# Patient Record
Sex: Female | Born: 1960 | Race: White | Hispanic: No | Marital: Single | Smoking: Never smoker
Health system: Southern US, Community
[De-identification: ages and names within clinical notes are randomized; demographics above are authoritative.]

## PROBLEM LIST (undated history)

## (undated) DIAGNOSIS — N803 Endometriosis of pelvic peritoneum: Secondary | ICD-10-CM

## (undated) DIAGNOSIS — F419 Anxiety disorder, unspecified: Secondary | ICD-10-CM

## (undated) DIAGNOSIS — N83209 Unspecified ovarian cyst, unspecified side: Secondary | ICD-10-CM

## (undated) DIAGNOSIS — F32A Depression, unspecified: Secondary | ICD-10-CM

## (undated) DIAGNOSIS — F329 Major depressive disorder, single episode, unspecified: Secondary | ICD-10-CM

## (undated) HISTORY — DX: Major depressive disorder, single episode, unspecified: F32.9

## (undated) HISTORY — DX: Anxiety disorder, unspecified: F41.9

## (undated) HISTORY — DX: Endometriosis of pelvic peritoneum: N80.3

## (undated) HISTORY — PX: WISDOM TOOTH EXTRACTION: SHX21

## (undated) HISTORY — DX: Unspecified ovarian cyst, unspecified side: N83.209

## (undated) HISTORY — PX: ACHILLES TENDON REPAIR: SUR1153

## (undated) HISTORY — DX: Depression, unspecified: F32.A

## (undated) HISTORY — PX: ABDOMINAL HYSTERECTOMY: SHX81

---

## 1998-08-27 ENCOUNTER — Other Ambulatory Visit: Admission: RE | Admit: 1998-08-27 | Discharge: 1998-08-27 | Payer: Self-pay | Admitting: *Deleted

## 1999-08-19 ENCOUNTER — Other Ambulatory Visit: Admission: RE | Admit: 1999-08-19 | Discharge: 1999-08-19 | Payer: Self-pay | Admitting: Obstetrics and Gynecology

## 2000-05-03 ENCOUNTER — Inpatient Hospital Stay (HOSPITAL_COMMUNITY): Admission: RE | Admit: 2000-05-03 | Discharge: 2000-05-05 | Payer: Self-pay | Admitting: Obstetrics and Gynecology

## 2000-05-03 ENCOUNTER — Encounter (INDEPENDENT_AMBULATORY_CARE_PROVIDER_SITE_OTHER): Payer: Self-pay

## 2001-04-27 ENCOUNTER — Other Ambulatory Visit: Admission: RE | Admit: 2001-04-27 | Discharge: 2001-04-27 | Payer: Self-pay | Admitting: Obstetrics and Gynecology

## 2002-10-23 ENCOUNTER — Encounter: Payer: Self-pay | Admitting: Family Medicine

## 2002-10-23 ENCOUNTER — Ambulatory Visit (HOSPITAL_COMMUNITY): Admission: RE | Admit: 2002-10-23 | Discharge: 2002-10-23 | Payer: Self-pay | Admitting: Family Medicine

## 2003-04-06 ENCOUNTER — Encounter: Payer: Self-pay | Admitting: Family Medicine

## 2003-04-06 LAB — CONVERTED CEMR LAB
Blood Glucose, Fasting: 109 mg/dL
TSH: 3.47 microintl units/mL

## 2004-04-09 ENCOUNTER — Ambulatory Visit: Payer: Self-pay | Admitting: Family Medicine

## 2004-04-09 LAB — CONVERTED CEMR LAB
Blood Glucose, Fasting: 91 mg/dL
RBC count: 4.49 10*6/uL
TSH: 2.69 microintl units/mL
WBC, blood: 7.2 10*3/uL

## 2004-04-16 ENCOUNTER — Ambulatory Visit: Payer: Self-pay | Admitting: Family Medicine

## 2004-04-29 ENCOUNTER — Ambulatory Visit (HOSPITAL_COMMUNITY): Admission: RE | Admit: 2004-04-29 | Discharge: 2004-04-29 | Payer: Self-pay | Admitting: Family Medicine

## 2004-06-10 ENCOUNTER — Ambulatory Visit: Payer: Self-pay | Admitting: Family Medicine

## 2004-07-30 ENCOUNTER — Ambulatory Visit: Payer: Self-pay | Admitting: Family Medicine

## 2004-08-22 ENCOUNTER — Ambulatory Visit: Payer: Self-pay | Admitting: Family Medicine

## 2004-09-10 ENCOUNTER — Ambulatory Visit: Payer: Self-pay | Admitting: Family Medicine

## 2004-09-11 ENCOUNTER — Ambulatory Visit: Payer: Self-pay | Admitting: Family Medicine

## 2004-12-17 ENCOUNTER — Ambulatory Visit: Payer: Self-pay | Admitting: Family Medicine

## 2004-12-17 LAB — CONVERTED CEMR LAB: TSH: 3.72 microintl units/mL

## 2004-12-19 ENCOUNTER — Ambulatory Visit: Payer: Self-pay | Admitting: Family Medicine

## 2005-03-23 ENCOUNTER — Ambulatory Visit: Payer: Self-pay | Admitting: Family Medicine

## 2005-06-22 ENCOUNTER — Ambulatory Visit: Payer: Self-pay | Admitting: Family Medicine

## 2005-06-22 LAB — CONVERTED CEMR LAB
Blood Glucose, Fasting: 97 mg/dL
TSH: 5.45 microintl units/mL

## 2005-06-25 ENCOUNTER — Ambulatory Visit: Payer: Self-pay | Admitting: Family Medicine

## 2005-07-07 ENCOUNTER — Encounter: Admission: RE | Admit: 2005-07-07 | Discharge: 2005-07-07 | Payer: Self-pay | Admitting: Family Medicine

## 2005-07-10 ENCOUNTER — Ambulatory Visit: Payer: Self-pay | Admitting: Family Medicine

## 2005-09-21 ENCOUNTER — Ambulatory Visit: Payer: Self-pay | Admitting: Family Medicine

## 2005-09-28 ENCOUNTER — Ambulatory Visit: Payer: Self-pay | Admitting: Family Medicine

## 2006-02-25 ENCOUNTER — Encounter: Payer: Self-pay | Admitting: Family Medicine

## 2006-02-25 ENCOUNTER — Ambulatory Visit: Payer: Self-pay | Admitting: Family Medicine

## 2006-02-25 LAB — CONVERTED CEMR LAB
Blood Glucose, Fasting: 90 mg/dL
RBC count: 4.51 10*6/uL
WBC, blood: 6.5 10*3/uL

## 2006-03-01 ENCOUNTER — Encounter: Admission: RE | Admit: 2006-03-01 | Discharge: 2006-03-01 | Payer: Self-pay | Admitting: Family Medicine

## 2006-05-31 ENCOUNTER — Ambulatory Visit: Payer: Self-pay | Admitting: Family Medicine

## 2006-06-28 ENCOUNTER — Ambulatory Visit: Payer: Self-pay | Admitting: Family Medicine

## 2006-06-28 LAB — CONVERTED CEMR LAB
ALT: 14 units/L (ref 0–40)
AST: 16 units/L (ref 0–37)
Albumin: 3.7 g/dL (ref 3.5–5.2)
Alkaline Phosphatase: 48 units/L (ref 39–117)
BUN: 10 mg/dL (ref 6–23)
Blood Glucose, Fasting: 83 mg/dL
Calcium: 8.8 mg/dL (ref 8.4–10.5)
Chloride: 105 meq/L (ref 96–112)
Creatinine, Ser: 0.6 mg/dL (ref 0.4–1.2)
GFR calc non Af Amer: 115 mL/min
HDL: 32.1 mg/dL — ABNORMAL LOW (ref >39.0)
LDL Cholesterol: 127 mg/dL — ABNORMAL HIGH (ref 0–99)
TSH: 4.89 microintl units/mL
TSH: 4.89 microintl units/mL (ref 0.35–5.50)
Total Bilirubin: 0.5 mg/dL (ref 0.3–1.2)
VLDL: 37 mg/dL (ref 0–40)

## 2006-06-30 ENCOUNTER — Ambulatory Visit: Payer: Self-pay | Admitting: Family Medicine

## 2006-08-18 ENCOUNTER — Encounter: Admission: RE | Admit: 2006-08-18 | Discharge: 2006-08-18 | Payer: Self-pay | Admitting: Family Medicine

## 2007-03-01 ENCOUNTER — Telehealth: Payer: Self-pay | Admitting: Family Medicine

## 2007-03-02 ENCOUNTER — Ambulatory Visit: Payer: Self-pay | Admitting: Family Medicine

## 2007-05-27 ENCOUNTER — Telehealth: Payer: Self-pay | Admitting: Family Medicine

## 2007-08-03 ENCOUNTER — Telehealth: Payer: Self-pay | Admitting: Family Medicine

## 2007-08-04 ENCOUNTER — Ambulatory Visit: Payer: Self-pay | Admitting: Family Medicine

## 2007-08-08 ENCOUNTER — Telehealth: Payer: Self-pay | Admitting: Family Medicine

## 2007-09-02 ENCOUNTER — Telehealth: Payer: Self-pay | Admitting: Family Medicine

## 2007-09-15 ENCOUNTER — Encounter: Payer: Self-pay | Admitting: Family Medicine

## 2007-11-08 ENCOUNTER — Ambulatory Visit: Payer: Self-pay | Admitting: Family Medicine

## 2007-11-08 DIAGNOSIS — F341 Dysthymic disorder: Secondary | ICD-10-CM | POA: Insufficient documentation

## 2007-11-08 DIAGNOSIS — E663 Overweight: Secondary | ICD-10-CM | POA: Insufficient documentation

## 2007-11-08 DIAGNOSIS — G43009 Migraine without aura, not intractable, without status migrainosus: Secondary | ICD-10-CM | POA: Insufficient documentation

## 2007-11-08 DIAGNOSIS — E785 Hyperlipidemia, unspecified: Secondary | ICD-10-CM | POA: Insufficient documentation

## 2007-11-09 ENCOUNTER — Encounter: Payer: Self-pay | Admitting: Family Medicine

## 2007-11-10 DIAGNOSIS — T7840XA Allergy, unspecified, initial encounter: Secondary | ICD-10-CM | POA: Insufficient documentation

## 2007-11-15 ENCOUNTER — Ambulatory Visit: Payer: Self-pay | Admitting: Family Medicine

## 2007-11-15 LAB — CONVERTED CEMR LAB
ALT: 10 units/L (ref 0–35)
AST: 15 units/L (ref 0–37)
Albumin: 3.9 g/dL (ref 3.5–5.2)
Alkaline Phosphatase: 55 units/L (ref 39–117)
BUN: 9 mg/dL (ref 6–23)
Basophils Absolute: 0 10*3/uL (ref 0.0–0.1)
Basophils Relative: 0.7 % (ref 0.0–1.0)
Bilirubin, Direct: 0.1 mg/dL (ref 0.0–0.3)
CO2: 26 meq/L (ref 19–32)
Calcium: 9.1 mg/dL (ref 8.4–10.5)
Chloride: 110 meq/L (ref 96–112)
Cholesterol: 208 mg/dL (ref 0–200)
Creatinine, Ser: 0.8 mg/dL (ref 0.4–1.2)
Direct LDL: 159.6 mg/dL
Eosinophils Absolute: 0.3 10*3/uL (ref 0.0–0.7)
Eosinophils Relative: 4.5 % (ref 0.0–5.0)
GFR calc Af Amer: 99 mL/min
GFR calc non Af Amer: 82 mL/min
Glucose, Bld: 103 mg/dL — ABNORMAL HIGH (ref 70–99)
HCT: 40.2 % (ref 36.0–46.0)
HDL: 32.5 mg/dL — ABNORMAL LOW (ref >39.0)
Hemoglobin: 13.8 g/dL (ref 12.0–15.0)
Lymphocytes Relative: 38.7 % (ref 12.0–46.0)
MCHC: 34.4 g/dL (ref 30.0–36.0)
MCV: 90.5 fL (ref 78.0–100.0)
Monocytes Absolute: 0.3 10*3/uL (ref 0.1–1.0)
Monocytes Relative: 5.5 % (ref 3.0–12.0)
Neutro Abs: 3.1 10*3/uL (ref 1.4–7.7)
Neutrophils Relative %: 50.6 % (ref 43.0–77.0)
Platelets: 270 10*3/uL (ref 150–400)
Potassium: 4.1 meq/L (ref 3.5–5.1)
RBC: 4.44 M/uL (ref 3.87–5.11)
RDW: 12.3 % (ref 11.5–14.6)
Sodium: 141 meq/L (ref 135–145)
TSH: 3.16 microintl units/mL (ref 0.35–5.50)
Total Bilirubin: 0.8 mg/dL (ref 0.3–1.2)
Total CHOL/HDL Ratio: 6.4
Total Protein: 6.7 g/dL (ref 6.0–8.3)
Triglycerides: 90 mg/dL (ref 0–149)
VLDL: 18 mg/dL (ref 0–40)
WBC: 6.1 10*3/uL (ref 4.5–10.5)

## 2007-12-07 ENCOUNTER — Telehealth (INDEPENDENT_AMBULATORY_CARE_PROVIDER_SITE_OTHER): Payer: Self-pay | Admitting: *Deleted

## 2007-12-27 ENCOUNTER — Ambulatory Visit: Payer: Self-pay | Admitting: Family Medicine

## 2008-01-03 ENCOUNTER — Ambulatory Visit: Payer: Self-pay | Admitting: Family Medicine

## 2008-01-09 ENCOUNTER — Telehealth: Payer: Self-pay | Admitting: Family Medicine

## 2008-04-03 ENCOUNTER — Telehealth: Payer: Self-pay | Admitting: Family Medicine

## 2008-04-16 ENCOUNTER — Telehealth: Payer: Self-pay | Admitting: Family Medicine

## 2008-07-19 ENCOUNTER — Telehealth (INDEPENDENT_AMBULATORY_CARE_PROVIDER_SITE_OTHER): Payer: Self-pay | Admitting: *Deleted

## 2008-07-19 ENCOUNTER — Ambulatory Visit: Payer: Self-pay | Admitting: Family Medicine

## 2008-08-22 ENCOUNTER — Telehealth: Payer: Self-pay | Admitting: Family Medicine

## 2008-11-13 ENCOUNTER — Telehealth: Payer: Self-pay | Admitting: Family Medicine

## 2008-11-16 ENCOUNTER — Ambulatory Visit: Payer: Self-pay | Admitting: Family Medicine

## 2008-11-16 LAB — CONVERTED CEMR LAB
ALT: 18 units/L (ref 0–35)
AST: 19 units/L (ref 0–37)
Albumin: 4.1 g/dL (ref 3.5–5.2)
Alkaline Phosphatase: 54 units/L (ref 39–117)
BUN: 9 mg/dL (ref 6–23)
Basophils Absolute: 0.1 10*3/uL (ref 0.0–0.1)
Basophils Relative: 1.1 % (ref 0.0–3.0)
Bilirubin, Direct: 0.1 mg/dL (ref 0.0–0.3)
CO2: 29 meq/L (ref 19–32)
Calcium: 9.1 mg/dL (ref 8.4–10.5)
Chloride: 108 meq/L (ref 96–112)
Cholesterol: 215 mg/dL — ABNORMAL HIGH (ref 0–200)
Creatinine, Ser: 0.9 mg/dL (ref 0.4–1.2)
Direct LDL: 180.6 mg/dL
Eosinophils Absolute: 0.3 10*3/uL (ref 0.0–0.7)
Eosinophils Relative: 5.3 % — ABNORMAL HIGH (ref 0.0–5.0)
GFR calc non Af Amer: 71.29 mL/min (ref >60)
Glucose, Bld: 98 mg/dL (ref 70–99)
HCT: 40.1 % (ref 36.0–46.0)
HDL: 43.5 mg/dL (ref >39.00)
Hemoglobin: 13.9 g/dL (ref 12.0–15.0)
Lymphocytes Relative: 44.6 % (ref 12.0–46.0)
Lymphs Abs: 2.3 10*3/uL (ref 0.7–4.0)
MCHC: 34.7 g/dL (ref 30.0–36.0)
MCV: 88.5 fL (ref 78.0–100.0)
Monocytes Absolute: 0.4 10*3/uL (ref 0.1–1.0)
Monocytes Relative: 7.5 % (ref 3.0–12.0)
Neutro Abs: 2.2 10*3/uL (ref 1.4–7.7)
Neutrophils Relative %: 41.5 % — ABNORMAL LOW (ref 43.0–77.0)
Platelets: 249 10*3/uL (ref 150.0–400.0)
Potassium: 4.3 meq/L (ref 3.5–5.1)
RBC: 4.53 M/uL (ref 3.87–5.11)
RDW: 12.5 % (ref 11.5–14.6)
Sodium: 143 meq/L (ref 135–145)
TSH: 3.27 microintl units/mL (ref 0.35–5.50)
Total Bilirubin: 0.5 mg/dL (ref 0.3–1.2)
Total CHOL/HDL Ratio: 5
Total Protein: 7 g/dL (ref 6.0–8.3)
Triglycerides: 59 mg/dL (ref 0.0–149.0)
VLDL: 11.8 mg/dL (ref 0.0–40.0)
WBC: 5.3 10*3/uL (ref 4.5–10.5)

## 2008-11-21 ENCOUNTER — Ambulatory Visit: Payer: Self-pay | Admitting: Family Medicine

## 2008-11-26 ENCOUNTER — Encounter: Admission: RE | Admit: 2008-11-26 | Discharge: 2008-11-26 | Payer: Self-pay | Admitting: Family Medicine

## 2008-11-28 ENCOUNTER — Encounter (INDEPENDENT_AMBULATORY_CARE_PROVIDER_SITE_OTHER): Payer: Self-pay | Admitting: *Deleted

## 2009-06-11 ENCOUNTER — Telehealth: Payer: Self-pay | Admitting: Family Medicine

## 2009-09-16 ENCOUNTER — Ambulatory Visit: Payer: Self-pay | Admitting: Family Medicine

## 2009-09-18 ENCOUNTER — Ambulatory Visit: Payer: Self-pay | Admitting: Family Medicine

## 2009-11-27 ENCOUNTER — Ambulatory Visit: Payer: Self-pay | Admitting: Family Medicine

## 2009-12-18 ENCOUNTER — Encounter (INDEPENDENT_AMBULATORY_CARE_PROVIDER_SITE_OTHER): Payer: Self-pay | Admitting: *Deleted

## 2009-12-30 ENCOUNTER — Telehealth: Payer: Self-pay | Admitting: Family Medicine

## 2010-02-14 ENCOUNTER — Ambulatory Visit: Payer: Self-pay | Admitting: Family Medicine

## 2010-02-19 ENCOUNTER — Ambulatory Visit: Payer: Self-pay | Admitting: Family Medicine

## 2010-02-19 LAB — CONVERTED CEMR LAB
ALT: 14 units/L (ref 0–35)
AST: 16 units/L (ref 0–37)
Albumin: 3.9 g/dL (ref 3.5–5.2)
Alkaline Phosphatase: 58 units/L (ref 39–117)
BUN: 12 mg/dL (ref 6–23)
Basophils Absolute: 0.1 10*3/uL (ref 0.0–0.1)
Basophils Relative: 1 % (ref 0.0–3.0)
Bilirubin, Direct: 0.1 mg/dL (ref 0.0–0.3)
CO2: 24 meq/L (ref 19–32)
Calcium: 8.9 mg/dL (ref 8.4–10.5)
Chloride: 106 meq/L (ref 96–112)
Cholesterol: 228 mg/dL — ABNORMAL HIGH (ref 0–200)
Creatinine, Ser: 0.7 mg/dL (ref 0.4–1.2)
Direct LDL: 187.3 mg/dL
Eosinophils Absolute: 0.3 10*3/uL (ref 0.0–0.7)
Eosinophils Relative: 3.9 % (ref 0.0–5.0)
GFR calc non Af Amer: 88.89 mL/min (ref >60)
Glucose, Bld: 99 mg/dL (ref 70–99)
HCT: 39.7 % (ref 36.0–46.0)
HDL: 48.2 mg/dL (ref >39.00)
Hemoglobin: 13.6 g/dL (ref 12.0–15.0)
Lymphocytes Relative: 36.4 % (ref 12.0–46.0)
Lymphs Abs: 2.4 10*3/uL (ref 0.7–4.0)
MCHC: 34.3 g/dL (ref 30.0–36.0)
MCV: 90.1 fL (ref 78.0–100.0)
Monocytes Absolute: 0.4 10*3/uL (ref 0.1–1.0)
Monocytes Relative: 6.3 % (ref 3.0–12.0)
Neutro Abs: 3.5 10*3/uL (ref 1.4–7.7)
Neutrophils Relative %: 52.4 % (ref 43.0–77.0)
Platelets: 253 10*3/uL (ref 150.0–400.0)
Potassium: 4.2 meq/L (ref 3.5–5.1)
RBC: 4.41 M/uL (ref 3.87–5.11)
RDW: 13.8 % (ref 11.5–14.6)
Sodium: 138 meq/L (ref 135–145)
TSH: 3.21 microintl units/mL (ref 0.35–5.50)
Total Bilirubin: 0.4 mg/dL (ref 0.3–1.2)
Total CHOL/HDL Ratio: 5
Total Protein: 6.6 g/dL (ref 6.0–8.3)
Triglycerides: 90 mg/dL (ref 0.0–149.0)
VLDL: 18 mg/dL (ref 0.0–40.0)
WBC: 6.6 10*3/uL (ref 4.5–10.5)

## 2010-04-01 ENCOUNTER — Encounter: Payer: Self-pay | Admitting: Family Medicine

## 2010-06-08 ENCOUNTER — Encounter: Payer: Self-pay | Admitting: Family Medicine
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
## 2010-06-17 NOTE — Letter (Signed)
Summary: Kendra Cannon letter  [place] [facility]  [address] [place], [place] [address]   Phone: [phone]  Fax: [phone]       12/18/2009 MRN: [id number]  Kendra Cannon [address] [place], [place]  [address]  Dear Ms. Kendra Cannon [facility] - Eugenio Saenz, and [place] announce the retirement of Arta Silence, M.D., from full-time practice at the [facility] office effective November 14, 2009 and his plans of returning part-time.  It is important to Dr. Hetty Ely and to our practice that you understand that [facility] has seven physicians in our office for your health care needs.  We will continue to offer the same exceptional care that you have today.    Dr. Hetty Ely has spoken to many of you about his plans for retirement and returning part-time in the fall.   We will continue to work with you through the transition to schedule appointments for you in the office and meet the high standards that [facility] is committed to.   Again, it is with great pleasure that we share the news that Dr. Hetty Ely will return to [facility] at [facility] in October of 2011 with a reduced schedule.    If you have any questions, or would like to request an appointment with one of our physicians, please call us at [phone] and press the option for Scheduling an appointment.  We take pleasure in providing you with excellent patient care and look forward to seeing you at your next office visit.  Our [facility] Physicians are:  Tillman Abide, M.D. Laurita Quint, M.D. Roxy Manns, M.D. Kerby Nora, M.D. Hannah Beat, M.D. Ruthe Mannan, M.D. We proudly welcomed Raechel Ache, M.D. and Eustaquio Boyden, M.D. to the practice in July/August 2011.  Sincerely,  [place] [facility]

## 2010-06-17 NOTE — Progress Notes (Signed)
Summary: Rx Alprazolam  Phone Note Refill Request Call back at [phone] Message from:  [facility] on June 11, 2009 9:23 AM  Refills Requested: Medication #1:  ALPRAZOLAM 0.5 MG  TABS 1 two times a day PRN   Last Refilled: 02/28/2009 Received faxed refill request, please advise   Method Requested: Telephone to Pharmacy Initial call taken by: Linde Gillis CMA Duncan Dull),  June 11, 2009 9:23 AM  Follow-up for Phone Call        Rx called to pharmacy Follow-up by: Linde Gillis CMA Duncan Dull),  June 11, 2009 12:16 PM    Prescriptions: ALPRAZOLAM 0.5 MG  TABS (ALPRAZOLAM) 1 two times a day PRN  #60 x 5   Entered and Authorized by:   Shaune Leeks MD   Signed by:   Shaune Leeks MD on 06/11/2009   Method used:   Telephoned to ...       [facility]* (retail)       [address]       [place], [place]  [address]       Ph: [phone]       Fax: [phone]   RxID:   [id number]

## 2010-06-17 NOTE — Consult Note (Signed)
Summary: Dr.Todd Hyatt,[facility],Note  Dr.Todd Hyatt,[facility],Note   Imported By: Beau Fanny 04/15/2010 13:24:50  _____________________________________________________________________  External Attachment:    Type:   Image     Comment:   External Document

## 2010-06-17 NOTE — Progress Notes (Signed)
Summary: refill request for alprazolam  Phone Note Refill Request Message from:  Fax from Pharmacy  Refills Requested: Medication #1:  ALPRAZOLAM 0.5 MG  TABS 1 two times a day as needed   Last Refilled: 11/26/2009 Faxed request from [place], [phone].   Initial call taken by: Lowella Petties CMA,  December 30, 2009 8:34 AM  Follow-up for Phone Call        please call in.  I was unable to send electronically.  Follow-up by: Crawford Givens MD,  December 30, 2009 9:57 AM  Additional Follow-up for Phone Call Additional follow up Details #1::        Medication phoned to pharmacy.  Additional Follow-up by: Delilah Shan CMA Desiray Orchard Dull),  December 30, 2009 10:27 AM    Prescriptions: ALPRAZOLAM 0.5 MG  TABS (ALPRAZOLAM) 1 two times a day as needed  #30 x 1   Entered and Authorized by:   Crawford Givens MD   Signed by:   Crawford Givens MD on 12/30/2009   Method used:   Telephoned to ...       [facility]* (retail)       [address]       [place], [place]  [address]       Ph: [phone]       Fax: [phone]   RxID:   [id number]

## 2010-06-17 NOTE — Assessment & Plan Note (Signed)
Summary: hurt ankle/alc   Vital Signs:  Patient profile:   50 year old female Weight:      230.75 pounds BMI:     37.38 Temp:     98.4 degrees F oral Pulse rate:   88 / minute Pulse rhythm:   regular BP sitting:   130 / 70  (left arm) Cuff size:   large  Vitals Entered By: Sydell Axon LPN (Sep 16, 1608 3:47 PM) CC: Injured back of left heel Saturday night   History of Present Illness: Here for having stretched playing badmitton Sat night with her nephew while barefooted and felt a pull in the back of her ankle with pain then progressively going from the back of the ankle to the heel and up the  posterior lower leg, pain into the foot with dorsiflexion and sciatica like pain with lifting the foot and leg anteriorally. She has not seen echymosis and is swollen only just lateral to the achillees area.  Problems Prior to Update: 1)  Other Screening Mammogram  (ICD-V76.12) 2)  Common Migraine  (ICD-346.10) 3)  Overweight  (ICD-278.02) 4)  Panic Attacks, Situational  (ICD-300.01) 5)  Allergy, Environmental  (ICD-995.3) 6)  Anxiety Depression  (ICD-300.4) 7)  Hyperlipidemia  (ICD-272.4) 8)  Health Maintenance Exam  (ICD-V70.0) 9)  R Medial Epicondylitis of Elbow  (ICD-726.31) 10)  Other Specified Disorder of Skin, Cyst of Scalp  (ICD-709.8)  Medications Prior to Update: 1)  Wellbutrin Xl 300 Mg  Tb24 (Bupropion Hcl) .Marland Kitchen.. 1 Daily By Mouth 2)  Alprazolam 0.5 Mg  Tabs (Alprazolam) .Marland Kitchen.. 1 Two Times A Day Prn 3)  Imitrex 100 Mg  Tabs (Sumatriptan Succinate) .... As Directed Before Migraine 4)  Tylenol Pm Extra Strength 500-25 Mg Tabs (Diphenhydramine-Apap (Sleep)) .... As Needed By Mouth 5)  Advil Pm 200-25 Mg Caps (Ibuprofen-Diphenhydramine Hcl) .... As Needed By Mouth  Allergies: 1)  ! Adult Aspirin Ec Low Strength (Aspirin)  Physical Exam  General:  Well-developed,well-nourished,in no acute distress; alert,appropriate and cooperative throughout examination Head:  Normocephalic  and atraumatic without obvious abnormalities. No apparent alopecia or balding. Sinuses NT. Extremities:  L ankle intact, achillees area posteriorally fairly tender, no steopoff felt. able to dorsiflex, plantar flex with discomfort. Mild swelling laterally, no echymosis seen.   Impression & Recommendations:  Problem # 1:  L ACHILLES SPRAIN AND STRAIN (ICD-845.09) Assessment New Aggressive heat and ice, slow walking until seen. Advil 200mg  x 3 after each meal. Refer to Dr Patsy Lager.  Complete Medication List: 1)  Wellbutrin Xl 300 Mg Tb24 (Bupropion hcl) .Marland Kitchen.. 1 daily by mouth 2)  Alprazolam 0.5 Mg Tabs (Alprazolam) .Marland Kitchen.. 1 two times a day as needed 3)  Imitrex 100 Mg Tabs (Sumatriptan succinate) .... As directed before migraine 4)  Tylenol Pm Extra Strength 500-25 Mg Tabs (Diphenhydramine-apap (sleep)) .... As needed by mouth 5)  Advil Pm 200-25 Mg Caps (Ibuprofen-diphenhydramine hcl) .... As needed by mouth  Patient Instructions: 1)  Refer to DrCopland Wed pls.  Current Allergies (reviewed today): ! ADULT ASPIRIN EC LOW STRENGTH (ASPIRIN)
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
## 2010-07-03 ENCOUNTER — Encounter: Payer: Self-pay | Admitting: Family Medicine

## 2010-07-03 ENCOUNTER — Ambulatory Visit (INDEPENDENT_AMBULATORY_CARE_PROVIDER_SITE_OTHER): Payer: Commercial Managed Care - PPO | Admitting: Family Medicine

## 2010-07-03 DIAGNOSIS — J069 Acute upper respiratory infection, unspecified: Secondary | ICD-10-CM

## 2010-07-09 NOTE — Letter (Signed)
Summary: Out of Work  [facility] [employer] [facility]  [address] [place], [place] [address]   Phone: [phone]  Fax: [phone]    July 03, 2010   Employee:  Kendra Cannon    To Whom It May Concern:   For Medical reasons, please excuse the above named employee from work for the following dates:  Start:   07/03/2010  End:   07/05/2010  If you need additional information, please feel free to contact our office.         Sincerely,    Shaune Leeks MD
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
## 2010-07-21 ENCOUNTER — Telehealth: Payer: Self-pay | Admitting: Family Medicine

## 2010-07-29 NOTE — Progress Notes (Signed)
Summary: Rx Bupropion  Phone Note Refill Request Call back at [phone] Message from:  [facility] on July 21, 2010 10:20 AM  Refills Requested: Medication #1:  WELLBUTRIN XL 300 MG  TB24 1 daily by mouth   Last Refilled: 06/17/2010 Initial call taken by: Sydell Axon LPN,  July 21, 2010 10:21 AM    Prescriptions: WELLBUTRIN XL 300 MG  TB24 (BUPROPION HCL) 1 daily by mouth  #30 x 6   Entered and Authorized by:   Shaune Leeks MD   Signed by:   Shaune Leeks MD on 07/21/2010   Method used:   Electronically to        [facility]* (retail)       [address]       [place], [place]  [address]       Ph: [phone]       Fax: [phone]   RxID:   [id number] WELLBUTRIN XL 300 MG  TB24 (BUPROPION HCL) 1 daily by mouth  #30 x 6   Entered and Authorized by:   Shaune Leeks MD   Signed by:   Shaune Leeks MD on 07/21/2010   Method used:   Electronically to        [facility]* (retail)       [address]       [place], [place]  [address]       Ph: [phone]       Fax: [phone]   RxID:   [id number]

## 2010-08-25 ENCOUNTER — Other Ambulatory Visit: Payer: Self-pay | Admitting: *Deleted

## 2010-08-25 MED ORDER — ALPRAZOLAM 0.5 MG PO TABS: 0.5000 mg | ORAL_TABLET | Freq: Two times a day (BID) | ORAL | Status: DC | PRN

## 2010-08-25 NOTE — Telephone Encounter (Signed)
Ok to refill #60, 2RF

## 2010-08-25 NOTE — Telephone Encounter (Signed)
Rx called in as directed.   

## 2010-09-26 ENCOUNTER — Other Ambulatory Visit: Payer: Self-pay | Admitting: *Deleted

## 2010-09-26 MED ORDER — SUMATRIPTAN SUCCINATE 100 MG PO TABS: 100.0000 mg | ORAL_TABLET | Freq: Once | ORAL | Status: AC | PRN

## 2010-09-26 NOTE — Telephone Encounter (Signed)
Patient is requesting refill on Imitrex.  She called on yesterday but got no response, she is completely out.  Please advise.  Uses [facility].  Dr. Hetty Ely is out of the office until Wednesday.

## 2010-09-26 NOTE — Telephone Encounter (Signed)
Sent, please advise patient

## 2010-09-26 NOTE — Telephone Encounter (Signed)
Patient advised.

## 2010-10-03 NOTE — Discharge Summary (Signed)
[facility]  Patient:    Kendra Cannon, Kendra Cannon                  MRN: [id number] Adm. Date:  [id number] Disc. Date: [id number] Attending:  Brynda Peon                           Discharge Summary  DISCHARGE DIAGNOSIS:  Dysfunctional uterine bleeding and pelvic pain due to endometriosis.  HISTORY OF PRESENT ILLNESS:  This is a 50 year old divorced white female, gravida 1, para 1, with a long history of DUB that was unresponsive to oral contraceptive therapy.  She also had persistent right lower quadrant pain.  HOSPITAL COURSE:  She was admitted on May 03, 2000, and underwent a TAH/RSO.  She was noted to have endometriosis in the cul-de-sac, but the right tube and ovary appeared normal.  The uterus contained several small myomas. She had an estimated blood loss of 250 cc and no complications. Postoperatively, she had an uneventful postoperative course and was ready for discharge on postoperative day #2.  She was given full discharge instructions regarding pelvic rest.  Follow-up in the office in four weeks.  She will come back in two days for staple removal.  She was given a prescription for Percocet #20 1 p.o. q.4h. p.r.n. pain.  Pathology report did document the endometriosis, but with the biopsies from the pelvic peritoneal and the cul-de-sac the uterus had benign leiomyomata, and the tube and ovary had no pathologic abnormalities.  LABORATORY DATA:  H&H on discharge was 10.9 and 31.1.  On admission it had been 12.9 and 35.9. DD:  05/05/00 TD:  05/06/00 Job: [id number]

## 2010-10-03 NOTE — Discharge Summary (Signed)
[facility]  Patient:    Kendra Cannon, Kendra Cannon                  MRN: [id number] Adm. Date:  [id number] Disc. Date: [id number] Attending:  Brynda Peon                           Discharge Summary  DISCHARGE DIAGNOSES:  Dysfunctional uterine bleeding and pelvic pain due to endometriosis.  HISTORY:  This is a 50 year old divorced white female, G1, P1 with a long history of DUB that was unresponsive to oral contraceptive therapy.  She also had persistent right lower quadrant pain.  She was admitted on May 03, 2000 and underwent a TAH and BSO. She was noted to have endometriosis in the cul-de-sac but the right tube and ovary appeared normal. The uterus contained several small myomas.  She had an estimated blood loss of 250 ccs and no complications.  Postoperatively, she had an uneventful postoperative course and was ready for discharge on postoperative day two.  LABORATORY AND ACCESSORY DATA:  Pathology report did document the endometriosis but was biopsied from the pelvic peritoneum in the cul-de-sac. The uterus had benign leiomyomata and the tube and ovary had no pathologic abnormalities.  H&H on discharge was 10.9 and 31.1.  On admission it had been 12.9 and 35.9.  DISCHARGE INSTRUCTIONS/FOLLOW-UP:  She was given full discharge instructions regarding pelvic rest, follow-up in the office in four weeks, she will come back in two days for staple removal.  DISCHARGE MEDICATIONS:  Percocet #20 1 p.o. q.4.h. p.r.n. pain. DD:  05/05/00 TD:  05/06/00 Job: [id number]

## 2010-10-03 NOTE — Op Note (Signed)
[facility]  Patient:    Kendra Cannon, Kendra Cannon                  MRN: [id number] Proc. Date: 05/03/00 Adm. Date:  [id number] Attending:  Brynda Peon                           Operative Report  PREOPERATIVE DIAGNOSES:  Dysfunctional uterine bleeding unresponsive to medical management, right lower quadrant pain.  POSTOPERATIVE DIAGNOSES:  Dysfunctional uterine bleeding unresponsive to medical management, right lower quadrant pain.  PROCEDURE:  Total abdominal hysterectomy, right salpingo-oophorectomy.  SURGEON:  Dr. Amedeo Kinsman.  ASSISTANT:  Dr. Myrlene Broker.  ANESTHESIA:  General endotracheal.  ESTIMATED BLOOD LOSS:  250 cc.  COMPLICATIONS:  None.  DESCRIPTION OF PROCEDURE:  The patient was taken to the operating room and after the induction of adequate general endotracheal anesthesia was prepped and draped in the usual fashion and Foley catheter inserted. A Pfannenstiel incision was made and carried down to the fascia with a deep knife. The fascia was nicked and opened transversely. Kochers were used to grasp the fascial margins and it was separated from the underlying rectus muscles with sharp dissection. The peritoneum was elevated between hemostats and entered atraumatically. The abdomen was inspected. The upper abdomen showed there to be a smooth liver. The gallbladder contained no stones. There was no periaortic adenopathy. In the pelvis, the uterus was slightly boggy. The right ovary had a cyst consist with a corpus gluteal cyst approximately 1 cm. The left ovary appeared normal. There were some filmy adhesions between the right ovary and the pelvic side wall. In the posterior cul-de-sac, there were some powder burn areas of endometriosis immediately beneath the cervix between the uterosacral ligaments. The OConner-OSullivan retractor was placed, the bowel was packed away. The uterus was grasped with long Kellys at the cornu.  The round ligaments were elevated, sutured and then cut with Mayo scissors on each side. The anterior leaf of the broad ligament was taken down on the patients left. The pedicle containing the tube, the utero-ovarian ligament and the round ligament was clamped, cut and doubly tied retaining the left ovary. On the right side, the infundibulopelvic ligament was clamped, cut and doubly tied and the tube and ovary were taken with a surgical specimen. The uterine arteries were skeletonized, clamped, and tied. The hysterectomy continued down the cardinal ligament, clamping, cutting, and tying in sequence. The uterosacral ligaments were taken separately. After the uterosacral ligament pedicle was taken, the next clamp that was taken actually entered the vagina beneath the cervix and Satinsky scissors were used to remove the specimen. The vaginal margins were grasped with a Kocher. Angled sutures were placed on the right and left sides and the vagina was closed anteriorly to posteriorly with figure-of-eight sutures. Good hemostasis was noted. The small powder burn area of endometriosis between the uterosacral ligaments was dissected out and a specimen sent to pathology for pathologic confirmation of the diagnosis. The pelvis was irrigated and was felt to be hemostatic. The bowel was allowed to return to its anatomic position, the packs were removed. The peritoneum was closed in a running fashion with #0 Vicryl. The fascia was closed with #0 Vicryl going from each angle towards the midline. Hemostasis was achieved, the subcu tissue was bovied and the skin was closed with staples. The patient tolerated the procedure well and went in satisfactory condition to post anesthesia recovery. DD:  05/03/00  TD:  05/04/00 Job: [id number]

## 2010-12-01 ENCOUNTER — Other Ambulatory Visit: Payer: Self-pay | Admitting: *Deleted

## 2010-12-01 MED ORDER — ALPRAZOLAM 0.5 MG PO TABS: 0.5000 mg | ORAL_TABLET | Freq: Two times a day (BID) | ORAL | Status: DC | PRN

## 2010-12-01 NOTE — Telephone Encounter (Signed)
Patient is out of medication today and can't wait for Dr. Hetty Ely.

## 2010-12-01 NOTE — Telephone Encounter (Signed)
Rx called in and message left notifying patient. 

## 2010-12-01 NOTE — Telephone Encounter (Signed)
Please phone in and notify.

## 2011-03-05 ENCOUNTER — Other Ambulatory Visit: Payer: Self-pay | Admitting: *Deleted

## 2011-03-05 MED ORDER — ALPRAZOLAM 0.5 MG PO TABS: 0.5000 mg | ORAL_TABLET | Freq: Two times a day (BID) | ORAL | Status: DC | PRN

## 2011-03-05 NOTE — Telephone Encounter (Signed)
ok 

## 2011-03-05 NOTE — Telephone Encounter (Signed)
Rx called in as directed.   

## 2011-03-05 NOTE — Telephone Encounter (Signed)
OK to refill in Dr. Lorenza Chick absence?

## 2011-07-02 ENCOUNTER — Ambulatory Visit (INDEPENDENT_AMBULATORY_CARE_PROVIDER_SITE_OTHER): Payer: Commercial Managed Care - PPO | Admitting: Psychology

## 2011-07-02 DIAGNOSIS — F4323 Adjustment disorder with mixed anxiety and depressed mood: Secondary | ICD-10-CM

## 2011-07-16 ENCOUNTER — Ambulatory Visit (INDEPENDENT_AMBULATORY_CARE_PROVIDER_SITE_OTHER): Payer: Commercial Managed Care - PPO | Admitting: Psychology

## 2011-07-16 DIAGNOSIS — F4323 Adjustment disorder with mixed anxiety and depressed mood: Secondary | ICD-10-CM

## 2011-07-28 ENCOUNTER — Ambulatory Visit (INDEPENDENT_AMBULATORY_CARE_PROVIDER_SITE_OTHER): Payer: Commercial Managed Care - PPO | Admitting: Psychology

## 2011-07-28 DIAGNOSIS — F4323 Adjustment disorder with mixed anxiety and depressed mood: Secondary | ICD-10-CM

## 2011-08-13 ENCOUNTER — Ambulatory Visit (INDEPENDENT_AMBULATORY_CARE_PROVIDER_SITE_OTHER): Payer: Commercial Managed Care - PPO | Admitting: Psychology

## 2011-08-13 DIAGNOSIS — F4323 Adjustment disorder with mixed anxiety and depressed mood: Secondary | ICD-10-CM

## 2011-08-27 ENCOUNTER — Ambulatory Visit (INDEPENDENT_AMBULATORY_CARE_PROVIDER_SITE_OTHER): Payer: Commercial Managed Care - PPO | Admitting: Psychology

## 2011-08-27 DIAGNOSIS — F4323 Adjustment disorder with mixed anxiety and depressed mood: Secondary | ICD-10-CM

## 2011-09-10 ENCOUNTER — Ambulatory Visit (INDEPENDENT_AMBULATORY_CARE_PROVIDER_SITE_OTHER): Payer: Commercial Managed Care - PPO | Admitting: Psychology

## 2011-09-10 DIAGNOSIS — F4323 Adjustment disorder with mixed anxiety and depressed mood: Secondary | ICD-10-CM

## 2012-10-20 ENCOUNTER — Encounter: Payer: Self-pay | Admitting: Gynecology

## 2012-10-20 ENCOUNTER — Ambulatory Visit (INDEPENDENT_AMBULATORY_CARE_PROVIDER_SITE_OTHER): Payer: Commercial Managed Care - PPO | Admitting: Gynecology

## 2012-10-20 VITALS — BP 110/60 | HR 80 | Resp 14 | Ht 66.75 in | Wt 242.0 lb

## 2012-10-20 DIAGNOSIS — Z90710 Acquired absence of both cervix and uterus: Secondary | ICD-10-CM

## 2012-10-20 DIAGNOSIS — R1031 Right lower quadrant pain: Secondary | ICD-10-CM

## 2012-10-20 DIAGNOSIS — Z9079 Acquired absence of other genital organ(s): Secondary | ICD-10-CM

## 2012-10-20 DIAGNOSIS — Z8742 Personal history of other diseases of the female genital tract: Secondary | ICD-10-CM

## 2012-10-20 NOTE — Progress Notes (Signed)
52 y.o.   Single    Caucasian   female   [id number]   Here complaining of  Right lower quadrant pain for 2weeks, pt reports having h/o recurent ovarian cysts in past and DUB in past resulting in TAH-USO.  Pt states pain is worse with ambulation better with sitting, now dull burning, upright sharp, non radiating, unsure if aleve is helping, not recently sexually active.   No symptoms of menopause-hot flashes, vaginal dryness or night sweats. Pt with full PE this week by PCP except breast exam, referred here to evaluate pain  Patient's last menstrual period was 10/20/1997.          Sexually active: yes  The current method of family planning is Hysterectomy.    Exercising: gym, treadmill, bike, some weights 3x/wk Last mammogram: 3 years ago  Last pap smear: 3 years ago History of abnormal pap:  Smoking: no Alcohol: 2 occ Last colonoscopy: none Last Bone Density:  none Last tetanus shot: 3-4 years ago Last cholesterol check: 10/17/12- Normal  Hgb: PCP    Urine:   Family History  Problem Relation Age of Onset  . Diabetes Mother     Type 2  . Hypertension Mother   . Lung cancer Father     Patient Active Problem List   Diagnosis Date Noted  . ALLERGY, ENVIRONMENTAL 11/10/2007  . HYPERLIPIDEMIA 11/08/2007  . OVERWEIGHT 11/08/2007  . ANXIETY DEPRESSION 11/08/2007  . COMMON MIGRAINE 11/08/2007    Past Medical History  Diagnosis Date  . Depression   . Anxiety     Past Surgical History  Procedure Laterality Date  . Wisdom tooth extraction  20 years ago  . Abdominal hysterectomy      14/15 years ago (Partial)    Allergies: Aspirin  Current Outpatient Prescriptions  Medication Sig Dispense Refill  . ALPRAZolam (XANAX) 0.5 MG tablet Take 1 tablet (0.5 mg total) by mouth 2 (two) times daily as needed.  60 tablet  2  . buPROPion (WELLBUTRIN XL) 300 MG 24 hr tablet Take 300 mg by mouth daily.       No current facility-administered medications for this visit.    ROS: Pertinent  items are noted in HPI.  Social Hx:    Exam:    BP 110/60  Pulse 80  Resp 14  Ht 5' 6.75" (1.695 m)  Wt 242 lb (109.77 kg)  BMI 38.21 kg/m2  LMP 10/20/1997   Wt Readings from Last 3 Encounters:  10/20/12 242 lb (109.77 kg)  07/03/10 242 lb 4 oz (109.884 kg)  02/19/10 236 lb (107.049 kg)     Ht Readings from Last 3 Encounters:  10/20/12 5' 6.75" (1.695 m)  11/27/09 5\' 6"  (1.676 m)  09/18/09 5\' 6"  (1.676 m)    General appearance: alert, cooperative and appears stated age Head: Normocephalic, without obvious abnormality, atraumatic Breasts: Inspection negative, No nipple retraction or dimpling, No nipple discharge or bleeding, No axillary or supraclavicular adenopathy, Normal to palpation without dominant masses Abdomen: soft, non-tender; bowel sounds normal; no masses,  no organomegaly Extremities: extremities normal, atraumatic, no cyanosis or edema Skin: Skin color, texture, turgor normal. No rashes or lesions Lymph nodes: supraclavicular, and axillary nodes normal. No abnormal inguinal nodes palpated Neurologic: Grossly normal   Pelvic: External genitalia:  no lesions              Urethra:  normal appearing urethra with no masses, tenderness or lesions  Bartholins and Skenes: normal                 Vagina: well supported, no discharge              Cervix: absent                     Bimanual Exam:  Uterus:  absent                                      Adnexa: tender without fullness on right, left absent                                      Rectovaginal: Confirms                                      Anus:  normal sphincter tone, no lesions  A: right lower quadrant pain History of TAH-USO for ovarian cysts     P:pelvic u/s to evaluate pain, triage based on findings con't OTC NSAIDS Reminded regarding need for mammogram-location info given, baseline coloscopy PAP guideline reviewed   An After Visit Summary was printed and given to the patient. s

## 2012-10-25 ENCOUNTER — Encounter: Payer: Self-pay | Admitting: Gynecology

## 2012-10-27 ENCOUNTER — Telehealth: Payer: Self-pay | Admitting: Gynecology

## 2012-10-27 NOTE — Telephone Encounter (Signed)
LMTCB to discuss insurance benefits and schedule PUS on home #. Spoke with patient on work #, scheduled PUS.

## 2012-11-08 ENCOUNTER — Ambulatory Visit (INDEPENDENT_AMBULATORY_CARE_PROVIDER_SITE_OTHER): Payer: Commercial Managed Care - PPO | Admitting: Gynecology

## 2012-11-08 ENCOUNTER — Encounter: Payer: Self-pay | Admitting: Gynecology

## 2012-11-08 ENCOUNTER — Ambulatory Visit (INDEPENDENT_AMBULATORY_CARE_PROVIDER_SITE_OTHER): Payer: Commercial Managed Care - PPO

## 2012-11-08 VITALS — BP 110/78 | HR 88 | Resp 16 | Ht 66.0 in | Wt 241.0 lb

## 2012-11-08 DIAGNOSIS — Z8742 Personal history of other diseases of the female genital tract: Secondary | ICD-10-CM

## 2012-11-08 DIAGNOSIS — N839 Noninflammatory disorder of ovary, fallopian tube and broad ligament, unspecified: Secondary | ICD-10-CM

## 2012-11-08 DIAGNOSIS — N7013 Chronic salpingitis and oophoritis: Secondary | ICD-10-CM

## 2012-11-08 DIAGNOSIS — R102 Pelvic and perineal pain: Secondary | ICD-10-CM

## 2012-11-08 DIAGNOSIS — N83299 Other ovarian cyst, unspecified side: Secondary | ICD-10-CM

## 2012-11-08 DIAGNOSIS — N838 Other noninflammatory disorders of ovary, fallopian tube and broad ligament: Secondary | ICD-10-CM

## 2012-11-08 DIAGNOSIS — N949 Unspecified condition associated with female genital organs and menstrual cycle: Secondary | ICD-10-CM

## 2012-11-08 DIAGNOSIS — N803 Endometriosis of pelvic peritoneum, unspecified: Secondary | ICD-10-CM

## 2012-11-08 DIAGNOSIS — N83209 Unspecified ovarian cyst, unspecified side: Secondary | ICD-10-CM

## 2012-11-08 DIAGNOSIS — R1031 Right lower quadrant pain: Secondary | ICD-10-CM

## 2012-11-08 DIAGNOSIS — Z90710 Acquired absence of both cervix and uterus: Secondary | ICD-10-CM

## 2012-11-08 DIAGNOSIS — Z9079 Acquired absence of other genital organ(s): Secondary | ICD-10-CM

## 2012-11-08 HISTORY — DX: Endometriosis of pelvic peritoneum: N80.3

## 2012-11-08 HISTORY — DX: Endometriosis of pelvic peritoneum, unspecified: N80.30

## 2012-11-08 NOTE — Patient Instructions (Signed)
Continue otc motrin, please contract office if pain worsens or does not go away to reschedule u/s

## 2012-11-08 NOTE — Progress Notes (Signed)
Pt for u/s for RLQ pain that began in mid may, she is s/p TAH-RSO 2001 for ovarian cysts, DUB.  Pt reports pain begain insidiously, still has, can awake from sleeep especially with change in position, worse with some lifting U/s images were reviewed with Pt.  Right adnexa surgically absent, left notable for suspected hydrosalpinx and thin walled ovarian cyst with some layering, avascular. Findings were reviewed with pt, we also were able to review the pathology from her hysterectomy which was remarkable for a peritoneal biopsy for endometriosis.  Based on that, the differential diagnosis is a nonruptured hemorrhagic cyst that is resolving vs an endometrioma.  Pt assured no free fluid in cul de sac, she may be experiencing pain due to pelvic adhesions. We discussed continuing NSAIDS and reassessing pain after 32m, if still persists will rescan and if unchanged or worse, consider removal of adnexa. Pt is agreeable to plan Questions addressed Length of time 31m, >50% face to face

## 2012-11-25 ENCOUNTER — Telehealth: Payer: Self-pay | Admitting: Gynecology

## 2012-11-25 NOTE — Telephone Encounter (Signed)
Please schedule u/s, should have open order since we were going to f/u cyst

## 2012-11-25 NOTE — Telephone Encounter (Signed)
Patient notified of need for ultra sound. Will call on Monday with date and time of u/s to be done. patient to call on Monday if no better and will work in to be seen.

## 2012-11-25 NOTE — Telephone Encounter (Signed)
Last office visit 11/08/2012 for pelvic pain patient calling today with pelvic pain this week that is progressively getting worse with movement or when walking. At work today and sitting and is ok. Stated at her last visit with you would need pelvic ultra sound if gets worse. Offered appointment for today but patient states would rather see if you want her to have ultra sound first then see you to decide next step. Please advise.

## 2012-11-25 NOTE — Telephone Encounter (Signed)
Pt says pain has gotten worse.please call to schedule an appt with dr lathrop.

## 2012-11-30 ENCOUNTER — Other Ambulatory Visit: Payer: Self-pay | Admitting: Gynecology

## 2012-11-30 DIAGNOSIS — N831 Corpus luteum cyst of ovary, unspecified side: Secondary | ICD-10-CM

## 2012-11-30 DIAGNOSIS — N7013 Chronic salpingitis and oophoritis: Secondary | ICD-10-CM

## 2012-12-06 ENCOUNTER — Telehealth: Payer: Self-pay | Admitting: *Deleted

## 2012-12-06 NOTE — Telephone Encounter (Signed)
Left Message To Call Back Re: Shredding/Picking up records

## 2012-12-12 ENCOUNTER — Ambulatory Visit (INDEPENDENT_AMBULATORY_CARE_PROVIDER_SITE_OTHER): Payer: Commercial Managed Care - PPO

## 2012-12-12 ENCOUNTER — Ambulatory Visit (INDEPENDENT_AMBULATORY_CARE_PROVIDER_SITE_OTHER): Payer: Commercial Managed Care - PPO | Admitting: Gynecology

## 2012-12-12 ENCOUNTER — Encounter: Payer: Self-pay | Admitting: Gynecology

## 2012-12-12 DIAGNOSIS — N949 Unspecified condition associated with female genital organs and menstrual cycle: Secondary | ICD-10-CM

## 2012-12-12 DIAGNOSIS — N803 Endometriosis of pelvic peritoneum: Secondary | ICD-10-CM

## 2012-12-12 DIAGNOSIS — R102 Pelvic and perineal pain: Secondary | ICD-10-CM

## 2012-12-12 DIAGNOSIS — N831 Corpus luteum cyst of ovary, unspecified side: Secondary | ICD-10-CM

## 2012-12-12 DIAGNOSIS — N7013 Chronic salpingitis and oophoritis: Secondary | ICD-10-CM

## 2012-12-12 NOTE — Progress Notes (Signed)
         Pt returned to office for right lower quadrant pain and f/u  U/s as a result.  Pt states that the pain initially resolved but it recur[place] so she kept the f/u visit.  Both today's and the previous u/s images were reviewed with the patient, the probable hydrosalpinx on the left was again noted and unchanged, the cyst on the left has now resolved and was therefore probably hemorrhagic in nature. The right is absent and unchanged.  We reviewed the precipitating factors for her pain, she reports that it can get worse with increased activities such as stairs or mowing. She is not sexually acitive.   I suggest that she might have an issue with a lumbar disc or musculoskeletal source.  I recommend she see either orthopedics or neuro and might need PT. Pt understands and would prefer to wait and see if rest does not improve this, she will give it a month and call back for referral if it is still present, she was instructed to call if symptoms worsen before the month and she agrees.

## 2012-12-12 NOTE — Telephone Encounter (Signed)
S/w Patient she has U/S appointment today will give records to her then.

## 2013-01-31 ENCOUNTER — Encounter: Payer: Self-pay | Admitting: Gynecology

## 2013-02-07 ENCOUNTER — Telehealth: Payer: Self-pay | Admitting: Gynecology

## 2013-02-07 ENCOUNTER — Other Ambulatory Visit: Payer: Self-pay | Admitting: Gynecology

## 2013-02-07 DIAGNOSIS — R102 Pelvic and perineal pain: Secondary | ICD-10-CM

## 2013-02-07 NOTE — Telephone Encounter (Signed)
LMTCB to schedule appt with ortho.

## 2013-02-08 NOTE — Telephone Encounter (Signed)
Spoke with patient. Advised that I sent her records to [facility] and provided her with their number.

## 2013-03-23 ENCOUNTER — Other Ambulatory Visit: Payer: Self-pay

## 2013-06-12 ENCOUNTER — Encounter: Payer: Self-pay | Admitting: Gynecology

## 2013-06-12 ENCOUNTER — Telehealth: Payer: Self-pay | Admitting: Emergency Medicine

## 2013-06-12 NOTE — Telephone Encounter (Signed)
Message left to return call to Mazi Brailsford at [phone].    

## 2013-06-12 NOTE — Telephone Encounter (Signed)
[facility] [[id number]]    From Bennye Almracy H Lathrop, MD [[id number]][[id number]]   To Kendra Cannon   Composed 06/12/2013 1:31 PM   For Delivery On 06/12/2013 1:31 PM   Subject RE: Non-Urgent Medical Question   Message Type Patient Medical Advice Request   Read Status Y   By Kendra PaulaKathy Cannon Fulk - last viewed at 1:34 PM on 06/12/2013   Message Body Sorry you are not felling well, our records indicate that you have not had a colonoscopy yet, I'd recommend yo see GI, I can refer you if you'd like but throwing up bile and pain should not wait. Do you think you need to go to the ER? Endometriosis can cause scar tissue that may cause some obstruction, I am happy to see you tomorrow if you'd like and we can go from there.  Dr Farrel GobbleLathrop   ----- Message -----  From: Kendra Cannon,Kendra Cannon  Sent: 06/12/2013 12:11 PM EST  To: Bennye AlmLATHROP,Tremayne Sheldon H, MD  Subject: Non-Urgent Medical Question   Can the Endometriosis cause the pain in my side, as well as being constipated and stick to my stomach? The pain in my side still remains a dull pain but it can be a week before I have a bowel movement and during this time the pain increases, I am sick on my stomach, and at times wake up throwing up what seems like bile. I have no fever or aches other than my side and upset stomach.

## 2013-06-28 ENCOUNTER — Other Ambulatory Visit: Payer: Self-pay | Admitting: Family Medicine

## 2013-06-28 DIAGNOSIS — Z1231 Encounter for screening mammogram for malignant neoplasm of breast: Secondary | ICD-10-CM

## 2013-07-06 ENCOUNTER — Ambulatory Visit
Admission: RE | Admit: 2013-07-06 | Discharge: 2013-07-06 | Disposition: A | Discharge status: Home / Self Care | Payer: Commercial Managed Care - PPO | Source: Ambulatory Visit | Attending: Family Medicine | Admitting: Family Medicine

## 2013-07-06 DIAGNOSIS — Z1231 Encounter for screening mammogram for malignant neoplasm of breast: Secondary | ICD-10-CM

## 2013-10-11 NOTE — Telephone Encounter (Signed)
Patient has contacted Dr. Farrel Gobble via Earleen Reaper. Will close encounter.   [facility] MESSAGE REPORT Message [[id number]]    From Bennye Alm, MD [[id number]]   To Takiera Mando [facility]   Composed 06/12/2013 5:22 PM   For Delivery On 06/12/2013 5:22 PM   Subject RE: Non-Urgent Medical Question   Message Type Patient Medical Advice Request   Read Status Y   By Lora Paula - last viewed at 9:07 AM on 06/13/2013   Message Body Dr [address]ta [address], she is good, please let me know if you had a preference, or if she is not in your plan and I will send in the request.  Dr Farrel Gobble   ----- Message -----  From: Lora Paula  Sent: 06/12/2013 1:43 PM EST  To: Bennye Alm, MD  Subject: RE: Non-Urgent Medical Question   I don't think ER is necessary. I've gotten used to the side pain. It was a couple weeks ago that the same symptoms occured with throwing up bile but this time it is the constipation, sick on my stomach and pain in my side luckily not throwing up. I cannot make an appointment tomorrow because of prev. appoint. for my mother. I will call & schedule an appoint. soon. Who would you refer as a GI.

## 2014-03-19 ENCOUNTER — Encounter: Payer: Self-pay | Admitting: Gynecology

## 2015-03-18 ENCOUNTER — Ambulatory Visit (INDEPENDENT_AMBULATORY_CARE_PROVIDER_SITE_OTHER): Payer: Commercial Managed Care - PPO | Admitting: Podiatry

## 2015-03-18 ENCOUNTER — Ambulatory Visit (INDEPENDENT_AMBULATORY_CARE_PROVIDER_SITE_OTHER): Payer: Commercial Managed Care - PPO

## 2015-03-18 ENCOUNTER — Encounter: Payer: Self-pay | Admitting: Podiatry

## 2015-03-18 DIAGNOSIS — M722 Plantar fascial fibromatosis: Secondary | ICD-10-CM | POA: Diagnosis not present

## 2015-03-18 NOTE — Progress Notes (Signed)
She presents today for a chief complaint of pain to her left heel. She states that this started approximately one month ago with sharp stabbing pains particularly in the morning and then again after having been seated for a period of time and then walking. She's tried stretching and shoe gear change to no avail. She denies changes in her past medical history medications allergies surgeries and social history.  Objective: Vital signs are stable alert and oriented 3. Pulses are palpable. Deep tendon reflexes are intact. Neurologic sensorium is intact. Muscle strength was 5 over 5 dorsiflexion plantar flexors and inverters everters all just musculature is intact. Orthopedic evaluation was resulted was distal to the ankle, full range of motion without crepitation. Pain on palpation medial calcaneal tubercle of the left heel mainly. 3 views left foot demonstrates soft tissue increase in density of the plantar fascial calcaneal insertion site.  Assessment: Plantar fasciitis left greater than right.  Plan: Started her on a Medrol Dosepak to be followed by meloxicam. Injected her left heel with Kenalog and local anesthetic dispensed the plantar fascial brace and light splint. Discussed proper shoe gear stretching exercises ice therapy and shoe gear modifications. I will follow-up with her in 1 month.  Kendra Cannon DPM

## 2015-03-19 ENCOUNTER — Telehealth: Payer: Self-pay | Admitting: *Deleted

## 2015-03-19 MED ORDER — METHYLPREDNISOLONE 4 MG PO TBPK: ORAL_TABLET | ORAL | Status: DC

## 2015-03-19 NOTE — Telephone Encounter (Addendum)
Pt states she was to pick up a steroid pack at the pharmacy, but it was not there.  I [facility] pt [facility]

## 2015-04-17 ENCOUNTER — Ambulatory Visit (INDEPENDENT_AMBULATORY_CARE_PROVIDER_SITE_OTHER): Payer: Commercial Managed Care - PPO | Admitting: Podiatry

## 2015-04-17 ENCOUNTER — Encounter: Payer: Self-pay | Admitting: Podiatry

## 2015-04-17 DIAGNOSIS — M722 Plantar fascial fibromatosis: Secondary | ICD-10-CM

## 2015-04-17 MED ORDER — MELOXICAM 15 MG PO TABS: 15.0000 mg | ORAL_TABLET | Freq: Every day | ORAL | Status: DC

## 2015-04-17 NOTE — Progress Notes (Signed)
She presents today for follow-up of bilateral plantar fasciitis. She states the left still hurts worse than the right that they're both doing much better if she relates percent improvement. She states that she is no longer taking the meloxicam because she finished the bottle. She did not get the ball refill.  Objective: Vital signs stable she is alert and oriented 3 pulses are palpable. She has pain on palpation medial calcaneal tubercles bilateral. Muscle strength is normal.  Assessment: Plantar fascitis bilateral left greater than right.  Plan: Restart meloxicam and I prescribed that for her again today electronically. I also reinjected her bilateral heels today. Follow up with her in 1 month if necessary. Consider orthotics at that time.

## 2015-05-15 ENCOUNTER — Ambulatory Visit: Payer: Commercial Managed Care - PPO | Admitting: Podiatry

## 2015-08-06 ENCOUNTER — Ambulatory Visit (INDEPENDENT_AMBULATORY_CARE_PROVIDER_SITE_OTHER): Payer: Commercial Managed Care - PPO | Admitting: Podiatry

## 2015-08-06 ENCOUNTER — Encounter: Payer: Self-pay | Admitting: Podiatry

## 2015-08-06 VITALS — BP 104/58 | HR 89 | Resp 16

## 2015-08-06 DIAGNOSIS — M722 Plantar fascial fibromatosis: Secondary | ICD-10-CM | POA: Diagnosis not present

## 2015-08-06 MED ORDER — DICLOFENAC SODIUM 75 MG PO TBEC: 75.0000 mg | DELAYED_RELEASE_TABLET | Freq: Two times a day (BID) | ORAL | Status: DC

## 2015-08-06 NOTE — Progress Notes (Signed)
She presents today for follow-up of plantar fasciitis bilateral. She states that her right foot as a proximally 90% improved however her left foot is still painful. She states that it's probably only 20% improved. Seems to be worsening. She states that she was supposed to come back in December but he was doing really well so she canceled that appointment.  Objective: Vital signs are stable she is alert and oriented 3 pulses are palpable bilateral pain bilateral. She has minimal pain on palpation near continued tubercle of the right heel but is still warm to the touch. Left heel does demonstrate severe pain on palpation medially continued tubercle no pain on medial and lateral compression of the calcaneus. No open lesions or wounds. The cellulitis drainage or odor.  Assessment: Chronic proximal plantar fasciitis bilateral left greater than right.  Plan: She was scanned for set up orthotics today. I also reinjected bilaterally. I changed her meloxicam to diclofenac 75 mg 1 by mouth twice a day. She will also continue to utilize the night splint and the plantar fasciitis braces I will follow up with her once her orthotics have arrived.

## 2015-08-26 ENCOUNTER — Telehealth: Payer: Self-pay | Admitting: *Deleted

## 2015-08-26 NOTE — Telephone Encounter (Signed)
Pt states she has an appt with [facility] for orthotics, and her card says to see Dr. Al CorpusHyatt, she wants to make sure she will see Dr. Al CorpusHyatt too, because she is having the same sensation as when she injured her achillies.  I spoke with pt she states she is having a burning pain along the side of the foot with the little toe,it feels like a tingling then pain.  I transferred to schedulers.

## 2015-08-27 ENCOUNTER — Ambulatory Visit (INDEPENDENT_AMBULATORY_CARE_PROVIDER_SITE_OTHER): Payer: Commercial Managed Care - PPO | Admitting: Sports Medicine

## 2015-08-27 ENCOUNTER — Ambulatory Visit (INDEPENDENT_AMBULATORY_CARE_PROVIDER_SITE_OTHER): Payer: Commercial Managed Care - PPO

## 2015-08-27 ENCOUNTER — Other Ambulatory Visit: Payer: Self-pay | Admitting: Sports Medicine

## 2015-08-27 ENCOUNTER — Encounter: Payer: Self-pay | Admitting: Sports Medicine

## 2015-08-27 VITALS — BP 130/75 | HR 67 | Resp 16

## 2015-08-27 DIAGNOSIS — M722 Plantar fascial fibromatosis: Secondary | ICD-10-CM

## 2015-08-27 DIAGNOSIS — M7752 Other enthesopathy of left foot: Secondary | ICD-10-CM | POA: Diagnosis not present

## 2015-08-27 DIAGNOSIS — M778 Other enthesopathies, not elsewhere classified: Secondary | ICD-10-CM

## 2015-08-27 DIAGNOSIS — M79672 Pain in left foot: Secondary | ICD-10-CM | POA: Diagnosis not present

## 2015-08-27 DIAGNOSIS — M779 Enthesopathy, unspecified: Secondary | ICD-10-CM

## 2015-08-27 DIAGNOSIS — M792 Neuralgia and neuritis, unspecified: Secondary | ICD-10-CM | POA: Diagnosis not present

## 2015-08-27 NOTE — Progress Notes (Signed)
Patient ID: Kendra Cannon, female   DOB: [birth year], 55 y.o.   MRN: [id number] Subjective: Kendra Cannon is a 55 y.o. female patient presents to office for pick up orthotics; also admits to numbness at 4-5 toes and pain at the side of the 5th toe that is sharp and radiates up foot and leg; reports that this is a new pain since last Thursday made worse with standing; states that this pain is different from her fasciitis pain and wanted to have it checked out. Denies trauma or any other causative factors.  Patient Active Problem List   Diagnosis Date Noted  . Complex ovarian cyst 11/08/2012  . 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 ALLERGY, ENVIRONMENTAL 11/10/2007  . HYPERLIPIDEMIA 11/08/2007  . OVERWEIGHT 11/08/2007  . ANXIETY DEPRESSION 11/08/2007  . COMMON MIGRAINE 11/08/2007    Current Outpatient Prescriptions on File Prior to Visit  Medication Sig Dispense Refill  . ALPRAZolam (XANAX) 0.5 MG tablet Take 0.5 mg by mouth.    Marland Kitchen buPROPion (WELLBUTRIN XL) 300 MG 24 hr tablet Take 300 mg by mouth.    . diclofenac (VOLTAREN) 75 MG EC tablet Take 1 tablet (75 mg total) by mouth 2 (two) times daily. 60 tablet 3  . meloxicam (MOBIC) 15 MG tablet Take 1 tablet (15 mg total) by mouth daily. 30 tablet 3  . SUMAtriptan (IMITREX) 50 MG tablet Take 50 mg by mouth.     No current facility-administered medications on file prior to visit.    Allergies  Allergen Reactions  . Aspirin Nausea Only    Objective: Physical Exam General: The patient is alert and oriented x3 in no acute distress.  Dermatology: Skin is warm, dry and supple bilateral lower extremities. Nails 1-10 are normal. There is no erythema, edema, no eccymosis, no open lesions present. Integument is otherwise unremarkable.  Vascular: Dorsalis Pedis pulse and Posterior Tibial pulse are 2/4 bilateral. Capillary fill time is immediate to all digits.  Neurological: Grossly intact to light touch with an achilles reflex of +2/5 and a   negative Tinel's sign bilateral. Subjective numbness at left 4-5 toes and a pop; moulders negative.   Musculoskeletal: Tenderness to palpation at the medial calcaneal tubercale and through the insertion of the plantar fascia on the left foot. There is also tenderness to palpation at left 5th MTPJ. No pain with compression of calcaneus bilateral. No pain with tuning fork to calcaneus bilateral. No pain with calf compression bilateral. There is decreased Ankle joint range of motion bilateral. All other joints range of motion within normal limits bilateral. Strength 5/5 in all groups bilateral.   Orthotics: Contour both feet well with no gaps and appear to provide functional support   Xray, Left foot:  Normal osseous mineralization. Joint spaces preserved. 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 Enesthopathy at 5th met base at Peroneus insertion, + Calcaneal spur present with mild thickening of plantar fascia. No other soft tissue abnormalities or radiopaque foreign bodies.   Assessment and Plan: Problem List Items Addressed This Visit    None    Visit Diagnoses    Plantar fasciitis of left foot    -  Primary    Capsulitis of foot, left        Neuritis        Foot pain, left          -Complete examination performed.  -Patient declined steroid injection at point of max pain at left 5th MTPJ; explained to patient that  this is likely compensation pain from poor biomechanics from changes in gait with her heel pain  -Cont with diclofenac until completed -Recommended good supportive shoes and to use new custom orthotics; break in period explained -Recommend topical icy hot with lidocaine or bio-freeze to areas of left foot pain and epsom salt soaks following by icing as needed -Cont with daily stretching exercises. -Patient to return to office in 4-6 weeks for follow up after orthtotics with Dr. Milinda Pointer or sooner if problems or  questions  Arise.  Landis Martins, DPM

## 2015-08-27 NOTE — Patient Instructions (Signed)

## 2015-10-02 ENCOUNTER — Ambulatory Visit (INDEPENDENT_AMBULATORY_CARE_PROVIDER_SITE_OTHER): Payer: Commercial Managed Care - PPO | Admitting: Podiatry

## 2015-10-02 DIAGNOSIS — M792 Neuralgia and neuritis, unspecified: Secondary | ICD-10-CM

## 2015-10-02 NOTE — Progress Notes (Signed)
She presents after wearing her orthotics for a while now stating that her orthotics are doing very good her plantar fasciitis is resolving. She still has some numbness between her fourth and fifth digits of her left foot.  Objective: Vital signs are stable she is alert and oriented 3. Some decreased sensorium per Semmes-Weinstein monofilament between the fourth and fifth digits of the left foot is more than likely associated lateral and stress syndrome or neuritis.  Assessment: Neuritis fourth interdigital space left foot. Well-healing plantar fasciitis left.  Plan: Continue use of the orthotics follow up with me should she have any regression.

## 2015-12-26 ENCOUNTER — Ambulatory Visit (INDEPENDENT_AMBULATORY_CARE_PROVIDER_SITE_OTHER): Payer: Commercial Managed Care - PPO | Admitting: Podiatry

## 2015-12-26 ENCOUNTER — Encounter: Payer: Self-pay | Admitting: Podiatry

## 2015-12-26 ENCOUNTER — Ambulatory Visit (INDEPENDENT_AMBULATORY_CARE_PROVIDER_SITE_OTHER): Payer: Commercial Managed Care - PPO

## 2015-12-26 DIAGNOSIS — S90222A Contusion of left lesser toe(s) with damage to nail, initial encounter: Secondary | ICD-10-CM

## 2015-12-26 DIAGNOSIS — R52 Pain, unspecified: Secondary | ICD-10-CM

## 2015-12-26 DIAGNOSIS — L601 Onycholysis: Secondary | ICD-10-CM | POA: Diagnosis not present

## 2015-12-26 MED ORDER — CEPHALEXIN 500 MG PO CAPS: 500.0000 mg | ORAL_CAPSULE | Freq: Three times a day (TID) | ORAL | 2 refills | Status: DC

## 2015-12-26 NOTE — Progress Notes (Signed)
Subjective: 55 year old female presents the office today for concerns of left big toenail pain. She states that she hit her to a couple days ago on a metal table and she has stubbed her toe a couple of times. She states that she has noticed redness from the nails was blood under the toenail the toenail is loose. The area is painful. She said no recent treatment. Denies any systemic complaints such as fevers, chills, nausea, vomiting. No acute changes since last appointment, and no other complaints at this time.   Objective: AAO x3, NAD DP/PT pulses palpable bilaterally, CRT less than 3 seconds There is subungual hematoma present in the left hallux toenail becomes retired toenail and there is swelling and redness on the proximal nail border the nail is loose. There is drainage expressed from the proximal nail borders as well as distally. There is no ascending cellulitis. There appears to be a collection of fluid on the proximal nail border. Tender to palpation left hallux toenail. No other areas of tenderness bilaterally. MMT 5/5, ROM WNL. No edema, erythema, increase in warmth to bilateral lower extremities.  No open lesions or pre-ulcerative lesions.  No pain with calf compression, swelling, warmth, erythema  Assessment: Left hallux total hematoma with onycholysis  Plan: -All treatment options discussed with the patient including all alternatives, risks, complications.  -X-rays were obtained and reviewed with the patient. No evidence of acute fracture. Small dorsal exostosis of the distal phalanx -At this time I recommended total nail avulsion. Understanding risks, complications she wishes to proceed. Under sterile conditions a mixture of lidocaine and Marcaine plain was infiltrated in a hallux block fashion. Once anesthetized the skin was prepped in a sterile fashion and tourniquet was applied. Next the left hallux toenail was then removed in total and there is found to be a small on the  purulence along the proximal nail border. The nail was very loose and only here along the medial and lateral nail border somewhat. Upon removal of the nail nail those granular and there is no laceration present. Area was cleaned and hemostasis achieved. Silvadene was applied followed by dressing. After application a dressing the tourniquet was removed and there is found to be an immediate capillary refill time. Post procedure instructions were discussed. Prescribed Keflex. Monitor for signs or symptoms of infection. Follow up in one week.  Ovid CurdMatthew Sierria Bruney, DPM

## 2015-12-27 ENCOUNTER — Ambulatory Visit: Payer: Commercial Managed Care - PPO | Admitting: Sports Medicine

## 2016-01-07 ENCOUNTER — Ambulatory Visit (INDEPENDENT_AMBULATORY_CARE_PROVIDER_SITE_OTHER): Payer: Commercial Managed Care - PPO | Admitting: Podiatry

## 2016-01-07 ENCOUNTER — Encounter: Payer: Self-pay | Admitting: Podiatry

## 2016-01-07 DIAGNOSIS — Z9889 Other specified postprocedural states: Secondary | ICD-10-CM

## 2016-01-07 DIAGNOSIS — S90222A Contusion of left lesser toe(s) with damage to nail, initial encounter: Secondary | ICD-10-CM

## 2016-01-07 NOTE — Patient Instructions (Signed)

## 2016-01-12 NOTE — Progress Notes (Signed)
Subjective: Kendra Cannon is a 55 y.o.  female returns to office today for follow up evaluation after having left Hallux total nail avulsion performed. Patient has been soaking using epsom satls and applying topical antibiotic covered with bandaid daily. Patient denies fevers, chills, nausea, vomiting. Denies any calf pain, chest pain, SOB.   Objective:  Vitals: Reviewed  General: Well developed, nourished, in no acute distress, alert and oriented x3   Dermatology: Skin is warm, dry and supple bilateral. Left hallux nail bed appears to be clean, dry, with scab formed. There is no surrounding erythema, edema, drainage/purulence. The remaining nails appear unremarkable at this time. There are no other lesions or other signs of infection present.  Neurovascular status: Intact. No lower extremity swelling; No pain with calf compression bilateral.  Musculoskeletal: No tenderness to palpation of the left hallux nail bed.. Muscular strength within normal limits bilateral.   Assesement and Plan: S/p partial nail avulsion, doing well.   -Continue soaking in epsom salts twice a day followed by antibiotic ointment and a band-aid. Can leave uncovered at night. Continue this until completely healed.  -If the area has not healed in 2 weeks, call the office for follow-up appointment, or sooner if any problems arise.  -Monitor for any signs/symptoms of infection. Call the office immediately if any occur or go directly to the emergency room. Call with any questions/concerns.  Ovid CurdMatthew Wagoner, DPM

## 2016-01-27 ENCOUNTER — Other Ambulatory Visit: Payer: Self-pay | Admitting: Podiatry

## 2016-02-05 NOTE — Telephone Encounter (Signed)
Pt needs an appt

## 2016-02-06 ENCOUNTER — Telehealth: Payer: Self-pay | Admitting: *Deleted

## 2016-02-06 NOTE — Telephone Encounter (Signed)
Pt states she received a refill of her medication and only received #15, does she need to make an appt. Left message informing pt that if she was continuing to need an antiinflammatory she would need an appt.

## 2016-02-24 ENCOUNTER — Ambulatory Visit (INDEPENDENT_AMBULATORY_CARE_PROVIDER_SITE_OTHER): Payer: Commercial Managed Care - PPO | Admitting: Podiatry

## 2016-02-24 DIAGNOSIS — M722 Plantar fascial fibromatosis: Secondary | ICD-10-CM | POA: Diagnosis not present

## 2016-02-24 MED ORDER — DICLOFENAC SODIUM 75 MG PO TBEC: 75.0000 mg | DELAYED_RELEASE_TABLET | Freq: Two times a day (BID) | ORAL | 11 refills | Status: AC

## 2016-02-25 NOTE — Progress Notes (Signed)
She presents today fo follow-up of her plantar fasciitis of her left foot. She states that she does great as long as she takes her diclofenac.  Objective: Vital signs are stable she is alert and oriented 3. Pulses are palpable. Neurologic system is intact. She has no pain on palpation of the left foot.  Assessment: Resolving plantar fasciitis with medication.  Plan: We dispensed a year supply of diclofenac.

## 2017-03-10 ENCOUNTER — Other Ambulatory Visit: Payer: Self-pay | Admitting: Physician Assistant

## 2017-03-10 DIAGNOSIS — Z1231 Encounter for screening mammogram for malignant neoplasm of breast: Secondary | ICD-10-CM

## 2017-03-30 ENCOUNTER — Ambulatory Visit
Admission: RE | Admit: 2017-03-30 | Discharge: 2017-03-30 | Disposition: A | Discharge status: Home / Self Care | Payer: Commercial Managed Care - PPO | Source: Ambulatory Visit | Attending: Physician Assistant | Admitting: Physician Assistant

## 2017-03-30 DIAGNOSIS — Z1231 Encounter for screening mammogram for malignant neoplasm of breast: Secondary | ICD-10-CM | POA: Diagnosis not present

## 2017-04-16 DIAGNOSIS — G43909 Migraine, unspecified, not intractable, without status migrainosus: Secondary | ICD-10-CM | POA: Diagnosis not present

## 2017-04-16 DIAGNOSIS — F410 Panic disorder [episodic paroxysmal anxiety] without agoraphobia: Secondary | ICD-10-CM | POA: Diagnosis not present

## 2017-04-16 DIAGNOSIS — F329 Major depressive disorder, single episode, unspecified: Secondary | ICD-10-CM | POA: Diagnosis not present

## 2017-04-16 DIAGNOSIS — Z79899 Other long term (current) drug therapy: Secondary | ICD-10-CM | POA: Diagnosis not present

## 2017-04-16 DIAGNOSIS — E7800 Pure hypercholesterolemia, unspecified: Secondary | ICD-10-CM | POA: Diagnosis not present

## 2017-05-25 DIAGNOSIS — Z1212 Encounter for screening for malignant neoplasm of rectum: Secondary | ICD-10-CM | POA: Diagnosis not present

## 2017-05-25 DIAGNOSIS — Z1211 Encounter for screening for malignant neoplasm of colon: Secondary | ICD-10-CM | POA: Diagnosis not present

## 2017-10-08 DIAGNOSIS — F329 Major depressive disorder, single episode, unspecified: Secondary | ICD-10-CM | POA: Diagnosis not present

## 2017-10-08 DIAGNOSIS — G43909 Migraine, unspecified, not intractable, without status migrainosus: Secondary | ICD-10-CM | POA: Diagnosis not present

## 2017-10-08 DIAGNOSIS — Z79899 Other long term (current) drug therapy: Secondary | ICD-10-CM | POA: Diagnosis not present

## 2017-10-08 DIAGNOSIS — Z6839 Body mass index (BMI) 39.0-39.9, adult: Secondary | ICD-10-CM | POA: Diagnosis not present

## 2017-10-08 DIAGNOSIS — F410 Panic disorder [episodic paroxysmal anxiety] without agoraphobia: Secondary | ICD-10-CM | POA: Diagnosis not present

## 2018-04-08 DIAGNOSIS — F329 Major depressive disorder, single episode, unspecified: Secondary | ICD-10-CM | POA: Diagnosis not present

## 2018-04-08 DIAGNOSIS — G43909 Migraine, unspecified, not intractable, without status migrainosus: Secondary | ICD-10-CM | POA: Diagnosis not present

## 2018-04-08 DIAGNOSIS — F410 Panic disorder [episodic paroxysmal anxiety] without agoraphobia: Secondary | ICD-10-CM | POA: Diagnosis not present

## 2018-04-08 DIAGNOSIS — E7800 Pure hypercholesterolemia, unspecified: Secondary | ICD-10-CM | POA: Diagnosis not present

## 2018-04-08 DIAGNOSIS — Z1331 Encounter for screening for depression: Secondary | ICD-10-CM | POA: Diagnosis not present

## 2018-04-08 DIAGNOSIS — Z79899 Other long term (current) drug therapy: Secondary | ICD-10-CM | POA: Diagnosis not present

## 2018-10-14 DIAGNOSIS — G4700 Insomnia, unspecified: Secondary | ICD-10-CM | POA: Diagnosis not present

## 2018-10-14 DIAGNOSIS — F410 Panic disorder [episodic paroxysmal anxiety] without agoraphobia: Secondary | ICD-10-CM | POA: Diagnosis not present

## 2018-10-14 DIAGNOSIS — F329 Major depressive disorder, single episode, unspecified: Secondary | ICD-10-CM | POA: Diagnosis not present

## 2018-10-14 DIAGNOSIS — G43909 Migraine, unspecified, not intractable, without status migrainosus: Secondary | ICD-10-CM | POA: Diagnosis not present

## 2018-10-14 DIAGNOSIS — Z79899 Other long term (current) drug therapy: Secondary | ICD-10-CM | POA: Diagnosis not present

## 2019-04-11 DIAGNOSIS — Z79899 Other long term (current) drug therapy: Secondary | ICD-10-CM | POA: Diagnosis not present

## 2019-04-11 DIAGNOSIS — E7800 Pure hypercholesterolemia, unspecified: Secondary | ICD-10-CM | POA: Diagnosis not present

## 2019-04-11 DIAGNOSIS — G43909 Migraine, unspecified, not intractable, without status migrainosus: Secondary | ICD-10-CM | POA: Diagnosis not present

## 2019-04-11 DIAGNOSIS — G4700 Insomnia, unspecified: Secondary | ICD-10-CM | POA: Diagnosis not present

## 2019-04-11 DIAGNOSIS — F410 Panic disorder [episodic paroxysmal anxiety] without agoraphobia: Secondary | ICD-10-CM | POA: Diagnosis not present

## 2019-06-21 DIAGNOSIS — H52203 Unspecified astigmatism, bilateral: Secondary | ICD-10-CM | POA: Diagnosis not present

## 2019-06-21 DIAGNOSIS — H524 Presbyopia: Secondary | ICD-10-CM | POA: Diagnosis not present

## 2019-06-21 DIAGNOSIS — H5203 Hypermetropia, bilateral: Secondary | ICD-10-CM | POA: Diagnosis not present

## 2019-09-21 ENCOUNTER — Other Ambulatory Visit: Payer: Self-pay | Admitting: Physician Assistant

## 2019-09-21 DIAGNOSIS — Z1231 Encounter for screening mammogram for malignant neoplasm of breast: Secondary | ICD-10-CM

## 2019-10-12 DIAGNOSIS — G4700 Insomnia, unspecified: Secondary | ICD-10-CM | POA: Diagnosis not present

## 2019-10-12 DIAGNOSIS — E7800 Pure hypercholesterolemia, unspecified: Secondary | ICD-10-CM | POA: Diagnosis not present

## 2019-10-12 DIAGNOSIS — Z79899 Other long term (current) drug therapy: Secondary | ICD-10-CM | POA: Diagnosis not present

## 2019-10-12 DIAGNOSIS — F410 Panic disorder [episodic paroxysmal anxiety] without agoraphobia: Secondary | ICD-10-CM | POA: Diagnosis not present

## 2019-10-12 DIAGNOSIS — G43909 Migraine, unspecified, not intractable, without status migrainosus: Secondary | ICD-10-CM | POA: Diagnosis not present

## 2020-02-19 DIAGNOSIS — S29019A Strain of muscle and tendon of unspecified wall of thorax, initial encounter: Secondary | ICD-10-CM | POA: Diagnosis not present

## 2020-04-15 ENCOUNTER — Other Ambulatory Visit: Payer: Self-pay | Admitting: Family Medicine

## 2020-04-15 DIAGNOSIS — E7800 Pure hypercholesterolemia, unspecified: Secondary | ICD-10-CM | POA: Diagnosis not present

## 2020-04-15 DIAGNOSIS — F410 Panic disorder [episodic paroxysmal anxiety] without agoraphobia: Secondary | ICD-10-CM | POA: Diagnosis not present

## 2020-04-15 DIAGNOSIS — Z79899 Other long term (current) drug therapy: Secondary | ICD-10-CM | POA: Diagnosis not present

## 2020-04-15 DIAGNOSIS — F339 Major depressive disorder, recurrent, unspecified: Secondary | ICD-10-CM | POA: Diagnosis not present

## 2020-04-15 DIAGNOSIS — R7989 Other specified abnormal findings of blood chemistry: Secondary | ICD-10-CM | POA: Diagnosis not present

## 2020-04-16 ENCOUNTER — Other Ambulatory Visit: Payer: Self-pay | Admitting: Family Medicine

## 2020-06-21 DIAGNOSIS — H5202 Hypermetropia, left eye: Secondary | ICD-10-CM | POA: Diagnosis not present

## 2020-06-21 DIAGNOSIS — H04123 Dry eye syndrome of bilateral lacrimal glands: Secondary | ICD-10-CM | POA: Diagnosis not present

## 2020-06-21 DIAGNOSIS — H18593 Other hereditary corneal dystrophies, bilateral: Secondary | ICD-10-CM | POA: Diagnosis not present

## 2020-06-21 DIAGNOSIS — H524 Presbyopia: Secondary | ICD-10-CM | POA: Diagnosis not present

## 2020-06-24 DIAGNOSIS — E039 Hypothyroidism, unspecified: Secondary | ICD-10-CM | POA: Diagnosis not present

## 2020-06-24 DIAGNOSIS — E7800 Pure hypercholesterolemia, unspecified: Secondary | ICD-10-CM | POA: Diagnosis not present

## 2020-09-27 DIAGNOSIS — E7800 Pure hypercholesterolemia, unspecified: Secondary | ICD-10-CM | POA: Diagnosis not present

## 2020-09-27 DIAGNOSIS — G43909 Migraine, unspecified, not intractable, without status migrainosus: Secondary | ICD-10-CM | POA: Diagnosis not present

## 2020-09-27 DIAGNOSIS — Z79899 Other long term (current) drug therapy: Secondary | ICD-10-CM | POA: Diagnosis not present

## 2020-09-27 DIAGNOSIS — E039 Hypothyroidism, unspecified: Secondary | ICD-10-CM | POA: Diagnosis not present

## 2020-09-27 DIAGNOSIS — F339 Major depressive disorder, recurrent, unspecified: Secondary | ICD-10-CM | POA: Diagnosis not present

## 2021-04-04 DIAGNOSIS — E039 Hypothyroidism, unspecified: Secondary | ICD-10-CM | POA: Diagnosis not present

## 2021-04-04 DIAGNOSIS — G43909 Migraine, unspecified, not intractable, without status migrainosus: Secondary | ICD-10-CM | POA: Diagnosis not present

## 2021-04-04 DIAGNOSIS — F339 Major depressive disorder, recurrent, unspecified: Secondary | ICD-10-CM | POA: Diagnosis not present

## 2021-04-04 DIAGNOSIS — E7800 Pure hypercholesterolemia, unspecified: Secondary | ICD-10-CM | POA: Diagnosis not present

## 2021-04-04 DIAGNOSIS — Z79899 Other long term (current) drug therapy: Secondary | ICD-10-CM | POA: Diagnosis not present

## 2021-05-23 DIAGNOSIS — Z1212 Encounter for screening for malignant neoplasm of rectum: Secondary | ICD-10-CM | POA: Diagnosis not present

## 2021-05-23 DIAGNOSIS — Z1211 Encounter for screening for malignant neoplasm of colon: Secondary | ICD-10-CM | POA: Diagnosis not present

## 2021-05-30 LAB — COLOGUARD: COLOGUARD: NEGATIVE

## 2021-06-25 DIAGNOSIS — H18593 Other hereditary corneal dystrophies, bilateral: Secondary | ICD-10-CM | POA: Diagnosis not present

## 2021-06-25 DIAGNOSIS — H2513 Age-related nuclear cataract, bilateral: Secondary | ICD-10-CM | POA: Diagnosis not present

## 2021-06-25 DIAGNOSIS — H04123 Dry eye syndrome of bilateral lacrimal glands: Secondary | ICD-10-CM | POA: Diagnosis not present

## 2021-06-25 DIAGNOSIS — H5203 Hypermetropia, bilateral: Secondary | ICD-10-CM | POA: Diagnosis not present

## 2021-08-04 ENCOUNTER — Other Ambulatory Visit: Payer: Self-pay | Admitting: Family Medicine

## 2021-08-04 DIAGNOSIS — Z1231 Encounter for screening mammogram for malignant neoplasm of breast: Secondary | ICD-10-CM

## 2021-08-13 ENCOUNTER — Ambulatory Visit
Admission: RE | Admit: 2021-08-13 | Discharge: 2021-08-13 | Disposition: A | Discharge status: Home / Self Care | Payer: BC Managed Care – PPO | Source: Ambulatory Visit | Attending: Family Medicine | Admitting: Family Medicine

## 2021-08-13 DIAGNOSIS — Z1231 Encounter for screening mammogram for malignant neoplasm of breast: Secondary | ICD-10-CM | POA: Diagnosis not present

## 2021-10-08 DIAGNOSIS — F339 Major depressive disorder, recurrent, unspecified: Secondary | ICD-10-CM | POA: Diagnosis not present

## 2021-10-08 DIAGNOSIS — G43909 Migraine, unspecified, not intractable, without status migrainosus: Secondary | ICD-10-CM | POA: Diagnosis not present

## 2021-10-08 DIAGNOSIS — E7800 Pure hypercholesterolemia, unspecified: Secondary | ICD-10-CM | POA: Diagnosis not present

## 2021-10-08 DIAGNOSIS — E039 Hypothyroidism, unspecified: Secondary | ICD-10-CM | POA: Diagnosis not present

## 2021-10-08 DIAGNOSIS — Z79899 Other long term (current) drug therapy: Secondary | ICD-10-CM | POA: Diagnosis not present

## 2021-11-06 DIAGNOSIS — S86112A Strain of other muscle(s) and tendon(s) of posterior muscle group at lower leg level, left leg, initial encounter: Secondary | ICD-10-CM | POA: Diagnosis not present

## 2021-11-20 DIAGNOSIS — S86112A Strain of other muscle(s) and tendon(s) of posterior muscle group at lower leg level, left leg, initial encounter: Secondary | ICD-10-CM | POA: Diagnosis not present

## 2022-04-16 DIAGNOSIS — E7800 Pure hypercholesterolemia, unspecified: Secondary | ICD-10-CM | POA: Diagnosis not present

## 2022-04-16 DIAGNOSIS — F339 Major depressive disorder, recurrent, unspecified: Secondary | ICD-10-CM | POA: Diagnosis not present

## 2022-04-16 DIAGNOSIS — E039 Hypothyroidism, unspecified: Secondary | ICD-10-CM | POA: Diagnosis not present

## 2022-04-16 DIAGNOSIS — Z79899 Other long term (current) drug therapy: Secondary | ICD-10-CM | POA: Diagnosis not present

## 2022-04-16 DIAGNOSIS — G43909 Migraine, unspecified, not intractable, without status migrainosus: Secondary | ICD-10-CM | POA: Diagnosis not present

## 2022-06-09 DIAGNOSIS — R0602 Shortness of breath: Secondary | ICD-10-CM | POA: Diagnosis not present

## 2022-06-09 DIAGNOSIS — R059 Cough, unspecified: Secondary | ICD-10-CM | POA: Diagnosis not present

## 2022-07-01 DIAGNOSIS — H18593 Other hereditary corneal dystrophies, bilateral: Secondary | ICD-10-CM | POA: Diagnosis not present

## 2022-07-01 DIAGNOSIS — H524 Presbyopia: Secondary | ICD-10-CM | POA: Diagnosis not present

## 2022-07-01 DIAGNOSIS — H04123 Dry eye syndrome of bilateral lacrimal glands: Secondary | ICD-10-CM | POA: Diagnosis not present

## 2022-07-01 DIAGNOSIS — H2513 Age-related nuclear cataract, bilateral: Secondary | ICD-10-CM | POA: Diagnosis not present

## 2022-07-01 DIAGNOSIS — H5203 Hypermetropia, bilateral: Secondary | ICD-10-CM | POA: Diagnosis not present

## 2022-09-08 DIAGNOSIS — Z6841 Body Mass Index (BMI) 40.0 and over, adult: Secondary | ICD-10-CM | POA: Diagnosis not present

## 2022-09-08 DIAGNOSIS — M5412 Radiculopathy, cervical region: Secondary | ICD-10-CM | POA: Diagnosis not present

## 2022-09-08 DIAGNOSIS — F410 Panic disorder [episodic paroxysmal anxiety] without agoraphobia: Secondary | ICD-10-CM | POA: Diagnosis not present

## 2022-10-01 DIAGNOSIS — G43909 Migraine, unspecified, not intractable, without status migrainosus: Secondary | ICD-10-CM | POA: Diagnosis not present

## 2022-10-01 DIAGNOSIS — Z79899 Other long term (current) drug therapy: Secondary | ICD-10-CM | POA: Diagnosis not present

## 2022-10-01 DIAGNOSIS — E039 Hypothyroidism, unspecified: Secondary | ICD-10-CM | POA: Diagnosis not present

## 2022-10-01 DIAGNOSIS — F339 Major depressive disorder, recurrent, unspecified: Secondary | ICD-10-CM | POA: Diagnosis not present

## 2022-10-01 DIAGNOSIS — E7800 Pure hypercholesterolemia, unspecified: Secondary | ICD-10-CM | POA: Diagnosis not present

## 2023-04-06 DIAGNOSIS — E039 Hypothyroidism, unspecified: Secondary | ICD-10-CM | POA: Diagnosis not present

## 2023-04-06 DIAGNOSIS — Z79899 Other long term (current) drug therapy: Secondary | ICD-10-CM | POA: Diagnosis not present

## 2023-04-06 DIAGNOSIS — F339 Major depressive disorder, recurrent, unspecified: Secondary | ICD-10-CM | POA: Diagnosis not present

## 2023-04-06 DIAGNOSIS — E7800 Pure hypercholesterolemia, unspecified: Secondary | ICD-10-CM | POA: Diagnosis not present

## 2023-04-06 DIAGNOSIS — G43909 Migraine, unspecified, not intractable, without status migrainosus: Secondary | ICD-10-CM | POA: Diagnosis not present

## 2023-05-04 DIAGNOSIS — M62830 Muscle spasm of back: Secondary | ICD-10-CM | POA: Diagnosis not present

## 2023-05-10 IMAGING — MG MM DIGITAL SCREENING BILAT W/ TOMO AND CAD
6 of 10 series · 6 of 30 positions shown · non-contrast
Comparison: Previous exam(s).

CLINICAL DATA: Screening.

EXAM:
DIGITAL SCREENING BILATERAL MAMMOGRAM WITH TOMOSYNTHESIS AND CAD
TECHNIQUE: Bilateral screening digital craniocaudal and mediolateral oblique
mammograms were obtained. Bilateral screening digital breast
tomosynthesis was performed. The images were evaluated with
computer-aided detection.

[L CC synth-2D]
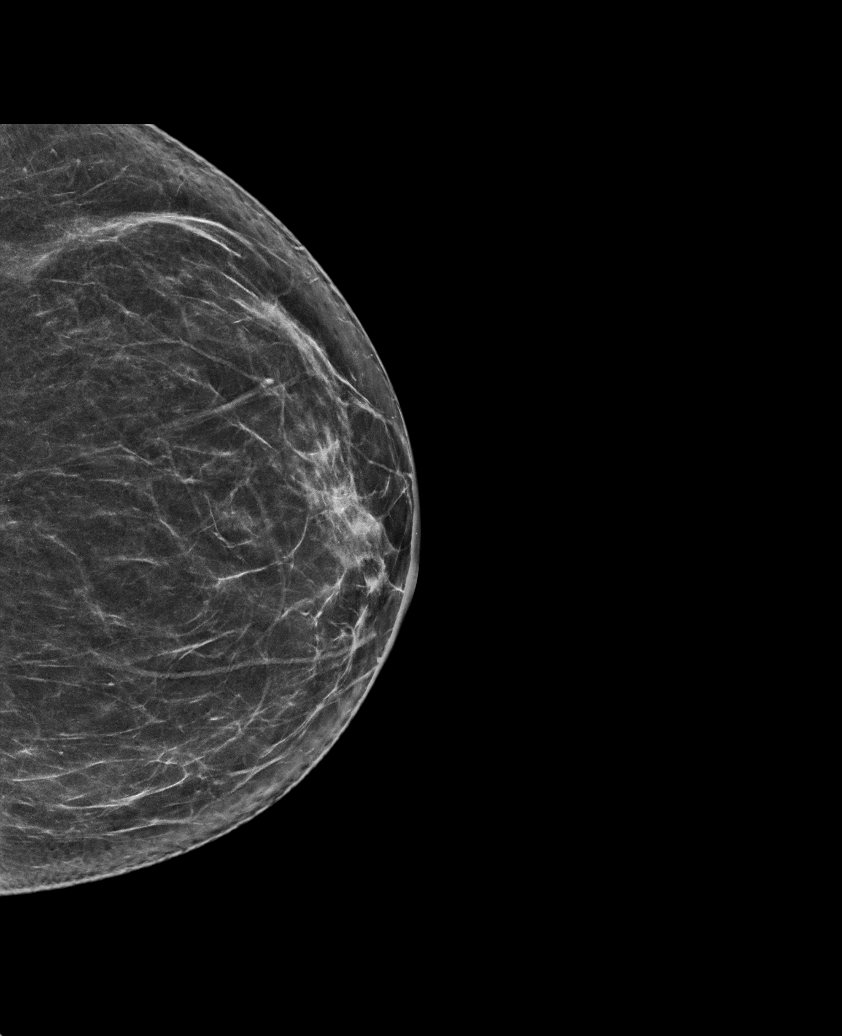

[R MLO synth-2D (1 of 2)]
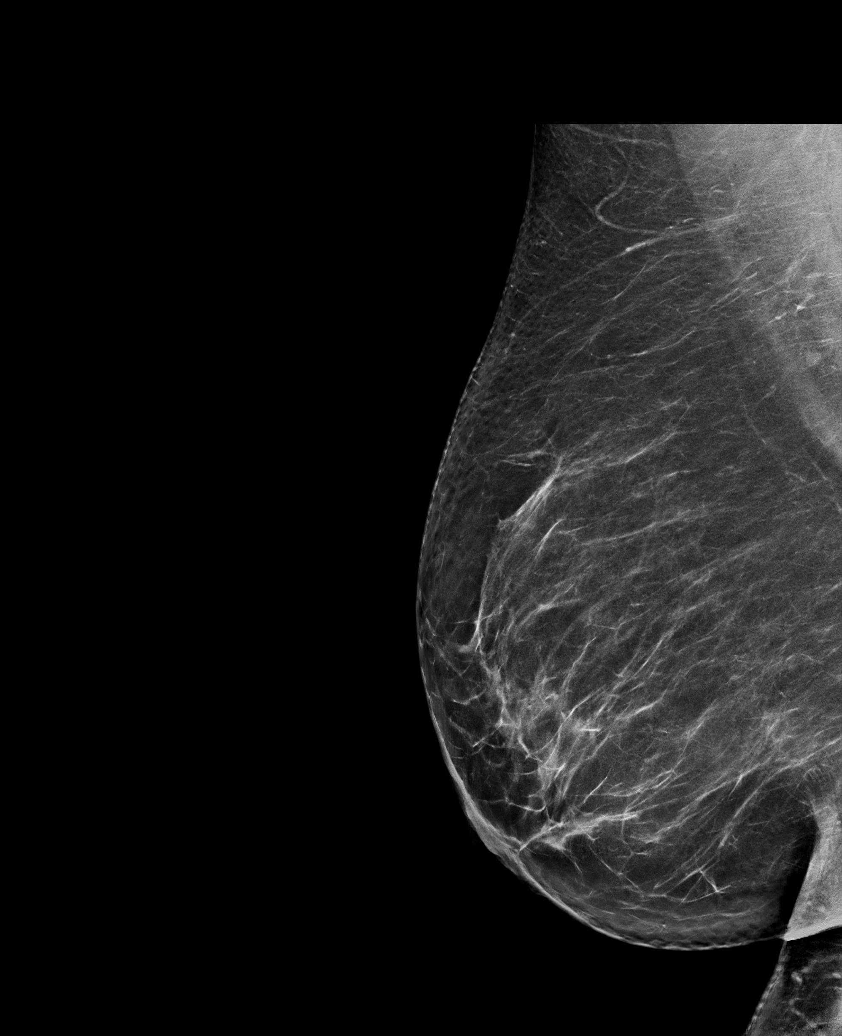

[R CC synth-2D]
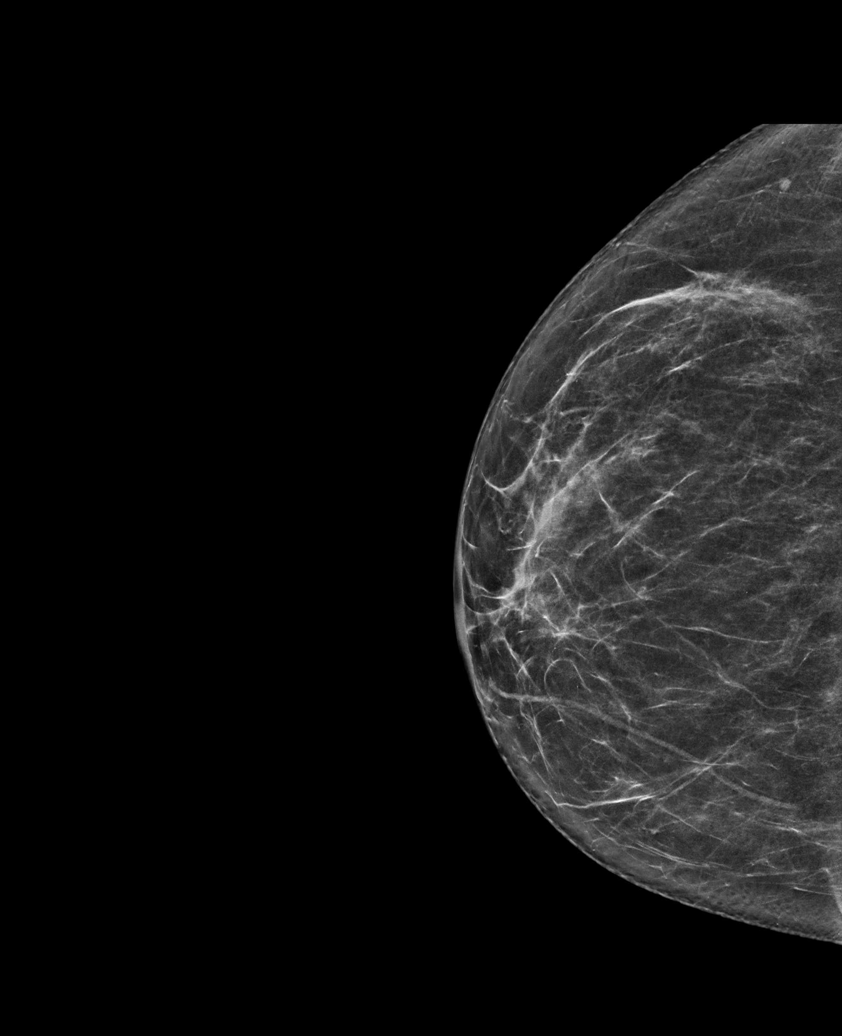

[L MLO synth-2D]
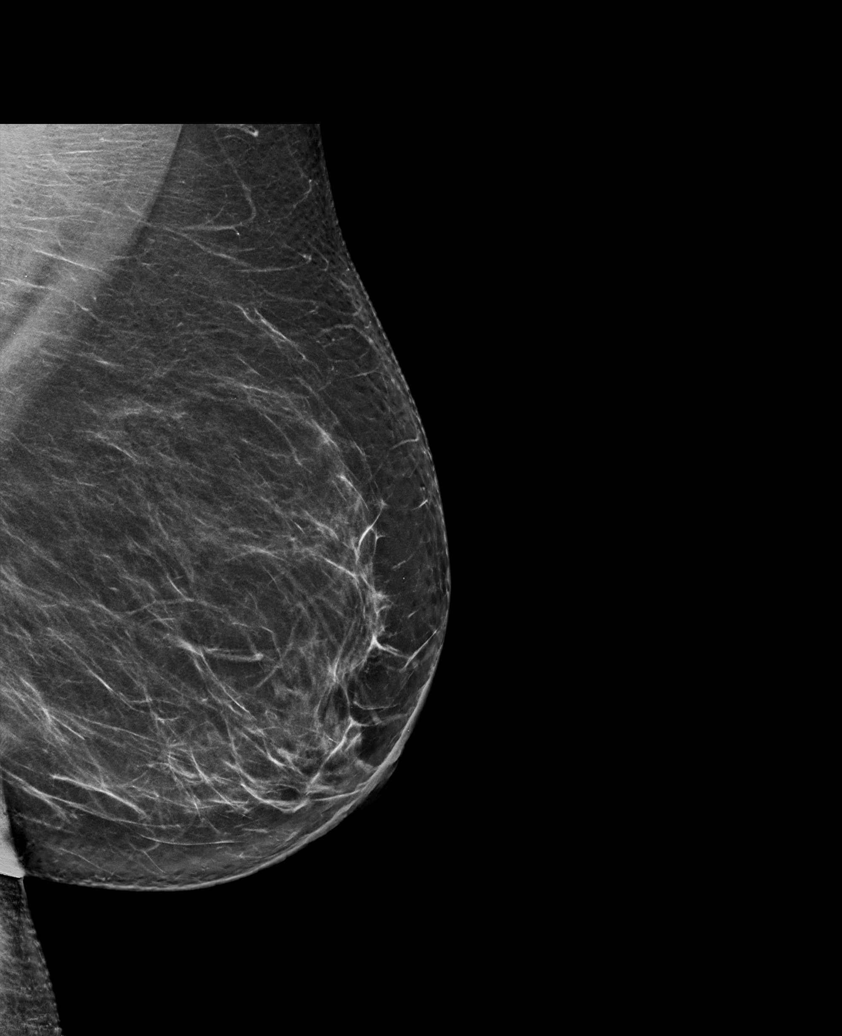

[R MLO synth-2D (2 of 2)]
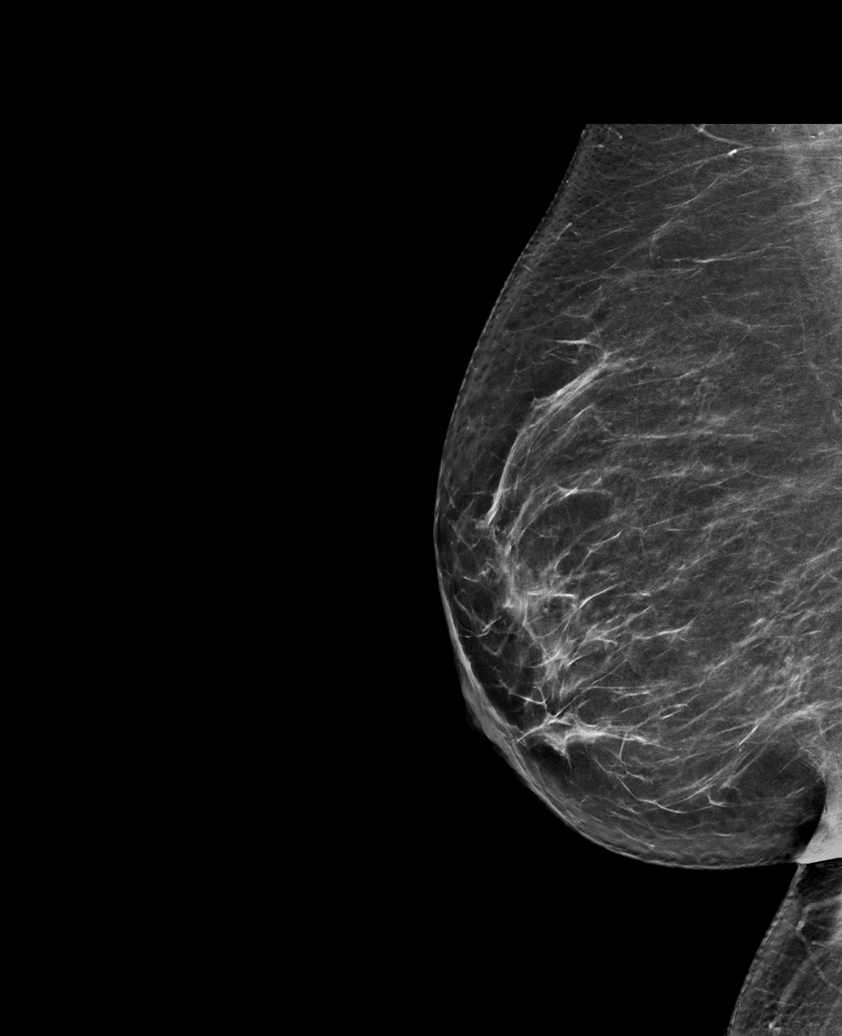

[R CC tomo · tomo slice 34/67.0]
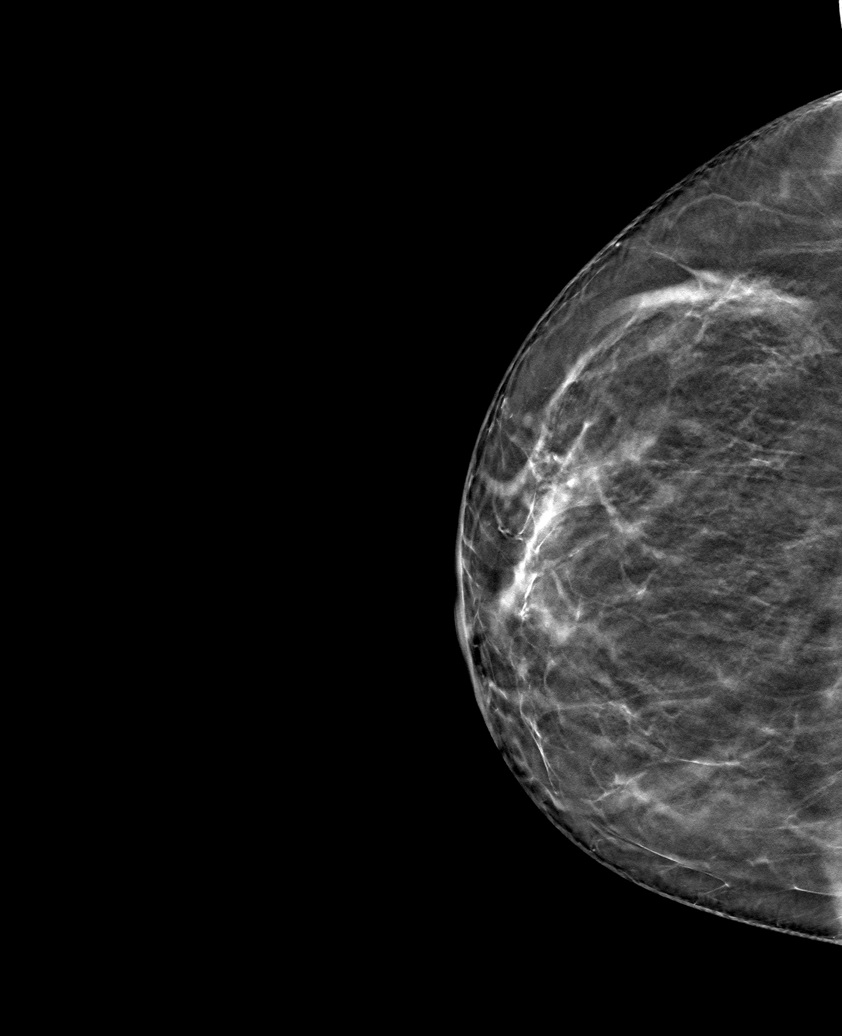

[6 of 30 positions shown; findings below may reference images not displayed]

ACR Breast Density Category b: There are scattered areas of
fibroglandular density.
FINDINGS: There are no findings suspicious for malignancy.
IMPRESSION: No mammographic evidence of malignancy. A result letter of this
screening mammogram will be mailed directly to the patient.

RECOMMENDATION:
Screening mammogram in one year. (Code:51-O-LD2)

BI-RADS CATEGORY  1: Negative.

## 2023-05-27 DIAGNOSIS — E039 Hypothyroidism, unspecified: Secondary | ICD-10-CM | POA: Diagnosis not present

## 2023-07-22 DIAGNOSIS — M25532 Pain in left wrist: Secondary | ICD-10-CM | POA: Diagnosis not present

## 2023-09-02 DIAGNOSIS — M25562 Pain in left knee: Secondary | ICD-10-CM | POA: Diagnosis not present

## 2023-10-12 DIAGNOSIS — F339 Major depressive disorder, recurrent, unspecified: Secondary | ICD-10-CM | POA: Diagnosis not present

## 2023-10-12 DIAGNOSIS — E039 Hypothyroidism, unspecified: Secondary | ICD-10-CM | POA: Diagnosis not present

## 2023-10-12 DIAGNOSIS — Z79899 Other long term (current) drug therapy: Secondary | ICD-10-CM | POA: Diagnosis not present

## 2023-10-12 DIAGNOSIS — G43909 Migraine, unspecified, not intractable, without status migrainosus: Secondary | ICD-10-CM | POA: Diagnosis not present

## 2023-10-12 DIAGNOSIS — E7800 Pure hypercholesterolemia, unspecified: Secondary | ICD-10-CM | POA: Diagnosis not present

## 2023-10-13 ENCOUNTER — Other Ambulatory Visit: Payer: Self-pay | Admitting: Family Medicine

## 2023-10-13 DIAGNOSIS — Z1231 Encounter for screening mammogram for malignant neoplasm of breast: Secondary | ICD-10-CM

## 2024-04-17 DIAGNOSIS — F339 Major depressive disorder, recurrent, unspecified: Secondary | ICD-10-CM | POA: Diagnosis not present

## 2024-04-17 DIAGNOSIS — Z79899 Other long term (current) drug therapy: Secondary | ICD-10-CM | POA: Diagnosis not present

## 2024-04-17 DIAGNOSIS — E7800 Pure hypercholesterolemia, unspecified: Secondary | ICD-10-CM | POA: Diagnosis not present

## 2024-04-17 DIAGNOSIS — G43909 Migraine, unspecified, not intractable, without status migrainosus: Secondary | ICD-10-CM | POA: Diagnosis not present

## 2024-04-17 DIAGNOSIS — E039 Hypothyroidism, unspecified: Secondary | ICD-10-CM | POA: Diagnosis not present
